# Patient Record
Sex: Female | Born: 1950 | Race: White | Hispanic: No | Marital: Married | Smoking: Former smoker
Health system: Southern US, Community
[De-identification: ages and names within clinical notes are randomized; demographics above are authoritative.]

## PROBLEM LIST (undated history)

## (undated) DIAGNOSIS — Z8614 Personal history of Methicillin resistant Staphylococcus aureus infection: Secondary | ICD-10-CM

## (undated) DIAGNOSIS — E119 Type 2 diabetes mellitus without complications: Secondary | ICD-10-CM

## (undated) DIAGNOSIS — H269 Unspecified cataract: Secondary | ICD-10-CM

## (undated) DIAGNOSIS — T7840XA Allergy, unspecified, initial encounter: Secondary | ICD-10-CM

## (undated) DIAGNOSIS — Z5189 Encounter for other specified aftercare: Secondary | ICD-10-CM

## (undated) DIAGNOSIS — E079 Disorder of thyroid, unspecified: Secondary | ICD-10-CM

## (undated) DIAGNOSIS — R011 Cardiac murmur, unspecified: Secondary | ICD-10-CM

## (undated) HISTORY — PX: CATARACT EXTRACTION: SUR2

## (undated) HISTORY — PX: LIPOMA EXCISION: SHX5283

## (undated) HISTORY — DX: Allergy, unspecified, initial encounter: T78.40XA

## (undated) HISTORY — PX: DILATION AND CURETTAGE OF UTERUS: SHX78

## (undated) HISTORY — DX: Unspecified cataract: H26.9

## (undated) HISTORY — DX: Personal history of Methicillin resistant Staphylococcus aureus infection: Z86.14

## (undated) HISTORY — DX: Encounter for other specified aftercare: Z51.89

## (undated) HISTORY — DX: Cardiac murmur, unspecified: R01.1

---

## 1977-05-01 DIAGNOSIS — Z5189 Encounter for other specified aftercare: Secondary | ICD-10-CM

## 1977-05-01 HISTORY — DX: Encounter for other specified aftercare: Z51.89

## 1993-05-01 HISTORY — PX: EYE SURGERY: SHX253

## 2009-05-01 HISTORY — PX: LIPOMA EXCISION: SHX5283

## 2015-12-27 ENCOUNTER — Emergency Department: Payer: BLUE CROSS/BLUE SHIELD

## 2015-12-27 ENCOUNTER — Emergency Department
Admission: EM | Admit: 2015-12-27 | Discharge: 2015-12-27 | Disposition: A | Discharge status: Home / Self Care | Payer: BLUE CROSS/BLUE SHIELD | Attending: Emergency Medicine | Admitting: Emergency Medicine

## 2015-12-27 ENCOUNTER — Encounter: Payer: Self-pay | Admitting: Emergency Medicine

## 2015-12-27 DIAGNOSIS — E119 Type 2 diabetes mellitus without complications: Secondary | ICD-10-CM | POA: Diagnosis not present

## 2015-12-27 DIAGNOSIS — G8929 Other chronic pain: Secondary | ICD-10-CM | POA: Diagnosis not present

## 2015-12-27 DIAGNOSIS — G510 Bell's palsy: Secondary | ICD-10-CM | POA: Diagnosis not present

## 2015-12-27 DIAGNOSIS — R29810 Facial weakness: Secondary | ICD-10-CM | POA: Diagnosis present

## 2015-12-27 DIAGNOSIS — I10 Essential (primary) hypertension: Secondary | ICD-10-CM | POA: Insufficient documentation

## 2015-12-27 DIAGNOSIS — M549 Dorsalgia, unspecified: Secondary | ICD-10-CM | POA: Diagnosis not present

## 2015-12-27 HISTORY — DX: Type 2 diabetes mellitus without complications: E11.9

## 2015-12-27 HISTORY — DX: Disorder of thyroid, unspecified: E07.9

## 2015-12-27 LAB — CBC WITH DIFFERENTIAL/PLATELET
BASOS ABS: 0 10*3/uL (ref 0–0.1)
Basophils Relative: 1 %
EOS PCT: 2 %
Eosinophils Absolute: 0.1 10*3/uL (ref 0–0.7)
HEMATOCRIT: 39.9 % (ref 35.0–47.0)
Hemoglobin: 13.9 g/dL (ref 12.0–16.0)
LYMPHS ABS: 1.7 10*3/uL (ref 1.0–3.6)
LYMPHS PCT: 26 %
MCH: 28.3 pg (ref 26.0–34.0)
MCHC: 34.9 g/dL (ref 32.0–36.0)
MCV: 81.3 fL (ref 80.0–100.0)
MONO ABS: 0.3 10*3/uL (ref 0.2–0.9)
MONOS PCT: 4 %
NEUTROS ABS: 4.6 10*3/uL (ref 1.4–6.5)
Neutrophils Relative %: 67 %
Platelets: 217 10*3/uL (ref 150–440)
RBC: 4.91 MIL/uL (ref 3.80–5.20)
RDW: 14.3 % (ref 11.5–14.5)
WBC: 6.8 10*3/uL (ref 3.6–11.0)

## 2015-12-27 LAB — COMPREHENSIVE METABOLIC PANEL WITH GFR
ALT: 22 U/L (ref 14–54)
AST: 26 U/L (ref 15–41)
Albumin: 4.5 g/dL (ref 3.5–5.0)
Alkaline Phosphatase: 66 U/L (ref 38–126)
Anion gap: 8 (ref 5–15)
BUN: 12 mg/dL (ref 6–20)
CO2: 26 mmol/L (ref 22–32)
Calcium: 9.3 mg/dL (ref 8.9–10.3)
Chloride: 104 mmol/L (ref 101–111)
Creatinine, Ser: 0.57 mg/dL (ref 0.44–1.00)
GFR calc Af Amer: >60 mL/min
GFR calc non Af Amer: >60 mL/min
Glucose, Bld: 186 mg/dL — ABNORMAL HIGH (ref 65–99)
Potassium: 3.9 mmol/L (ref 3.5–5.1)
Sodium: 138 mmol/L (ref 135–145)
Total Bilirubin: 0.4 mg/dL (ref 0.3–1.2)
Total Protein: 7.3 g/dL (ref 6.5–8.1)

## 2015-12-27 LAB — TROPONIN I: Troponin I: <0.03 ng/mL

## 2015-12-27 MED ORDER — VALACYCLOVIR HCL 1 G PO TABS: 1000.0000 mg | ORAL_TABLET | Freq: Three times a day (TID) | ORAL | 0 refills | Status: AC

## 2015-12-27 MED ORDER — PREDNISONE 20 MG PO TABS: ORAL_TABLET | ORAL | 0 refills | Status: AC

## 2015-12-27 NOTE — ED Triage Notes (Signed)
Pt states noticed that she could not close her left eye three days ago and since has resolved and noticed that her smile was crooked. Denies any trouble with speech and denies any weakness. Some facial droop noted to right side of mouth.

## 2015-12-27 NOTE — ED Provider Notes (Signed)
[facility]-EMERGENCY DEPARTMENT Provider Note   CSN: [id number] Arrival date & time: 12/27/15  1478     History   Chief Complaint Chief Complaint  Patient presents with  . Facial Droop    left eye would not close three days ago but has now resolved    HPI Catherine Dodson is a 66 y.o. female hx of DM, HTN, Here presenting with right facial droop, trouble closing right eye. Patient states that the last 2 days she noticed that her right eye is a little dry and has some trouble closing her right eye. She woke up this morning and realized the right side of her face looks abnormal. Denies any trouble speaking or weakness. She does have chronic right sided back pain and is seeing a Land. She does noticed shooting pain on the right side of her chest for the last 2 days. Denies shortness of breath or rash. Denies fevers or tick bites. Doesn't remember if she has previous chicken pox or not.   The history is provided by the patient.    Past Medical History:  Diagnosis Date  . Diabetes mellitus without complication (HCC)   . Hypertension   . Thyroid disease     There are no active problems to display for this patient.   Past Surgical History:  Procedure Laterality Date  . ABDOMINAL SURGERY      OB History    No data available       Home Medications    Prior to Admission medications   Not on File    Family History No family history on file.  Social History Social History  Substance Use Topics  . Smoking status: Never Smoker  . Smokeless tobacco: Never Used  . Alcohol use No     Allergies   Ceclor [cefaclor]; Morphine and related; Penicillins; and Xylocaine [lidocaine hcl]   Review of Systems Review of Systems  Musculoskeletal: Positive for back pain.  Neurological: Positive for weakness.  All other systems reviewed and are negative.    Physical Exam Updated Vital Signs BP (!) 157/85 (BP Location: Right Arm)   Pulse 78   Temp 97.7 F (36.5 C)  (Oral)   Resp 16   Ht 5\' 6"  (1.676 m)   Wt 162 lb (73.5 kg)   SpO2 98%   BMI 26.15 kg/m   Physical Exam  Constitutional: She is oriented to person, place, and time. She appears well-developed and well-nourished.  Slightly anxious   HENT:  Head: Normocephalic.  Eyes: EOM are normal. Pupils are equal, round, and reactive to light.  Neck: Normal range of motion. Neck supple.  Cardiovascular: Normal rate, regular rhythm and normal heart sounds.   Pulmonary/Chest: Breath sounds normal. No respiratory distress. She has no rales.  No obvious rash on R side of chest   Abdominal: Soft. Bowel sounds are normal. She exhibits no distension. There is no tenderness.  Musculoskeletal: Normal range of motion.  Neurological: She is alert and oriented to person, place, and time.  R facial droop. Also trouble closing R eye. Slightly dec sensation R face. Nl strength bilateral upper and lower extremities.   Skin: Skin is warm.  Psychiatric: She has a normal mood and affect.  Nursing note and vitals reviewed.    ED Treatments / Results  Labs (all labs ordered are listed, but only abnormal results are displayed) Labs Reviewed  COMPREHENSIVE METABOLIC PANEL - Abnormal; Notable for the following:       Result Value  Glucose, Bld 186 (*)    All other components within normal limits  CBC WITH DIFFERENTIAL/PLATELET  TROPONIN I    EKG  EKG Interpretation None       ED ECG REPORT I, Richardean Canalavid H Marci Polito, the attending physician, personally viewed and interpreted this ECG.   Date: 12/27/2015  EKG Time: 11:57 am  Rate: 80  Rhythm: normal EKG, normal sinus rhythm  Axis: normal  Intervals:none  ST&T Change: nonspecific   Radiology Dg Chest 2 View  Result Date: 12/27/2015 CLINICAL DATA:  Right rib pain. EXAM: CHEST  2 VIEW COMPARISON:  None. FINDINGS: The heart size and mediastinal contours are within normal limits. Both lungs are clear. No pneumothorax or pleural effusion is noted. The visualized  skeletal structures are unremarkable. IMPRESSION: No active cardiopulmonary disease. Electronically Signed   By: Lupita RaiderJames  Green Jr, M.D.   On: 12/27/2015 12:44   Ct Head Wo Contrast  Result Date: 12/27/2015 CLINICAL DATA:  Right-sided facial droop for 3 days. EXAM: CT HEAD WITHOUT CONTRAST TECHNIQUE: Contiguous axial images were obtained from the base of the skull through the vertex without intravenous contrast. COMPARISON:  None. FINDINGS: Bony calvarium appears intact. No mass effect or midline shift is noted. Ventricular size is within normal limits. There is no evidence of mass lesion, hemorrhage or acute infarction. IMPRESSION: Normal head CT. Electronically Signed   By: Lupita RaiderJames  Green Jr, M.D.   On: 12/27/2015 10:47    Procedures Procedures (including critical care time)  Medications Ordered in ED Medications - No data to display   Initial Impression / Assessment and Plan / ED Course  I have reviewed the triage vital signs and the nursing notes.  Pertinent labs & imaging results that were available during my care of the patient were reviewed by me and considered in my medical decision making (see chart for details).  Clinical Course   Catherine Dodson is a 65 y.o. female here with R facial droop, trouble closing R eye. 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 Nl strength throughout. Has chronic back pain but nl neuro exam. Has R sided chest pain with no obvious rash suggestive of shingles. Will check labs, CT head. Likely dc home with prednisone, valtrex.   1:33 PM CT head unremarkable. Labs showed glucose 186 with nl AG. She has hx of DM and is on meds already. Will give valtrex, prednisone. Told her that she should monitor her blood sugar frequently as steroids may increase her glucose.    Final Clinical Impressions(s) / ED Diagnoses   Final diagnoses:  None    New Prescriptions New Prescriptions   No medications on file     Charlynne Panderavid Hsienta Sharyon Peitz, MD 12/27/15 1334

## 2015-12-27 NOTE — Discharge Instructions (Signed)
Take prednisone as prescribed.   Take valtrex three times daily for a week.   Use eye drops to the right eye if your eye is dry. Please tape your eye or put eye patch at night if you have trouble closing your eye.   See your doctor.   Return to ER if you have worse weakness, trouble speaking, blurry vision, fevers, rash.

## 2016-01-18 ENCOUNTER — Other Ambulatory Visit: Payer: Self-pay | Admitting: Obstetrics & Gynecology

## 2016-01-18 DIAGNOSIS — Z1231 Encounter for screening mammogram for malignant neoplasm of breast: Secondary | ICD-10-CM

## 2016-02-02 ENCOUNTER — Other Ambulatory Visit: Payer: Self-pay | Admitting: Obstetrics & Gynecology

## 2016-02-02 ENCOUNTER — Ambulatory Visit
Admission: RE | Admit: 2016-02-02 | Discharge: 2016-02-02 | Disposition: A | Discharge status: Home / Self Care | Payer: BLUE CROSS/BLUE SHIELD | Source: Ambulatory Visit | Attending: Obstetrics & Gynecology | Admitting: Obstetrics & Gynecology

## 2016-02-02 DIAGNOSIS — Z1231 Encounter for screening mammogram for malignant neoplasm of breast: Secondary | ICD-10-CM

## 2016-02-09 ENCOUNTER — Inpatient Hospital Stay
Admission: RE | Admit: 2016-02-09 | Discharge: 2016-02-09 | Disposition: A | Discharge status: Home / Self Care | Payer: Self-pay | Source: Ambulatory Visit | Attending: *Deleted | Admitting: *Deleted

## 2016-02-09 ENCOUNTER — Other Ambulatory Visit: Payer: Self-pay | Admitting: *Deleted

## 2016-02-09 DIAGNOSIS — Z9289 Personal history of other medical treatment: Secondary | ICD-10-CM

## 2016-04-14 ENCOUNTER — Ambulatory Visit: Payer: BLUE CROSS/BLUE SHIELD | Admitting: Family Medicine

## 2016-05-18 ENCOUNTER — Ambulatory Visit: Payer: BLUE CROSS/BLUE SHIELD | Admitting: Family Medicine

## 2016-05-19 ENCOUNTER — Telehealth: Payer: Self-pay | Admitting: General Practice

## 2016-05-19 NOTE — Telephone Encounter (Signed)
She has not seen me, therefore I cannot refill. She has seen Endo. They can refill until she sees me.

## 2016-05-19 NOTE — Telephone Encounter (Signed)
Pt had an appointment yesterday and got rescheduled. Endo wants her to be seen and pt cant afford 150$. Pt was given 06/08/16 for her NP appt.

## 2016-05-19 NOTE — Telephone Encounter (Signed)
Okay to refill up to her appt in February?

## 2016-05-19 NOTE — Telephone Encounter (Signed)
[facility] called pt is needing a refill for metformin extended release 500mg  2 qd bid.  [facility] [place] [phone]. Thank you!

## 2016-05-22 ENCOUNTER — Encounter: Payer: Self-pay | Admitting: Family Medicine

## 2016-05-22 NOTE — Telephone Encounter (Signed)
Pt aware.

## 2016-05-22 NOTE — Telephone Encounter (Signed)
Needs appt

## 2016-06-08 ENCOUNTER — Ambulatory Visit (INDEPENDENT_AMBULATORY_CARE_PROVIDER_SITE_OTHER): Payer: BLUE CROSS/BLUE SHIELD | Admitting: Family Medicine

## 2016-06-08 ENCOUNTER — Encounter: Payer: Self-pay | Admitting: Family Medicine

## 2016-06-08 VITALS — BP 164/91 | HR 83 | Temp 98.1°F | Ht 65.5 in | Wt 172.4 lb

## 2016-06-08 DIAGNOSIS — E039 Hypothyroidism, unspecified: Secondary | ICD-10-CM | POA: Diagnosis not present

## 2016-06-08 DIAGNOSIS — R030 Elevated blood-pressure reading, without diagnosis of hypertension: Secondary | ICD-10-CM | POA: Diagnosis not present

## 2016-06-08 DIAGNOSIS — E119 Type 2 diabetes mellitus without complications: Secondary | ICD-10-CM

## 2016-06-08 DIAGNOSIS — Z789 Other specified health status: Secondary | ICD-10-CM

## 2016-06-08 LAB — LIPID PANEL
CHOLESTEROL: 210 mg/dL — AB (ref 0–200)
HDL: 51.4 mg/dL (ref >39.00)
LDL CALC: 128 mg/dL — AB (ref 0–99)
NonHDL: 158.63
Total CHOL/HDL Ratio: 4
Triglycerides: 154 mg/dL — ABNORMAL HIGH (ref 0.0–149.0)
VLDL: 30.8 mg/dL (ref 0.0–40.0)

## 2016-06-08 LAB — TSH: TSH: 1.27 u[IU]/mL (ref 0.35–4.50)

## 2016-06-08 LAB — HEMOGLOBIN A1C: Hgb A1c MFr Bld: 9.5 % — ABNORMAL HIGH (ref 4.6–6.5)

## 2016-06-08 MED ORDER — GLUCOSE BLOOD VI STRP: ORAL_STRIP | 12 refills | Status: DC

## 2016-06-08 MED ORDER — THYROID 90 MG PO TABS: 90.0000 mg | ORAL_TABLET | Freq: Every day | ORAL | 3 refills | Status: DC

## 2016-06-08 MED ORDER — GLYBURIDE 5 MG PO TABS: 5.0000 mg | ORAL_TABLET | Freq: Two times a day (BID) | ORAL | 1 refills | Status: DC

## 2016-06-08 MED ORDER — METFORMIN HCL ER 500 MG PO TB24: 1000.0000 mg | ORAL_TABLET | Freq: Two times a day (BID) | ORAL | 1 refills | Status: DC

## 2016-06-08 NOTE — Assessment & Plan Note (Signed)
Stable. TSH today.  Continue current dose of NP thyroid.

## 2016-06-08 NOTE — Progress Notes (Signed)
Subjective:  Patient ID: Catherine Dodson, female    DOB: 04/29/1951  Age: 66 y.o. MRN: [id number]  CC: Establish care  HPI Catherine Dodson is a 66 y.o. female presents to the clinic today to establish care. Issues are below.   DM-2  Blood sugars readings - Has not been checking her sugars as she has been out of strips. Uncontrolled per A1C's available.  Hypoglycemia - No.  Medications - Metformin ER 1000 mg BID, Glyburide 5 mg BID.  Adverse effects - Has some GI symptoms from Metformin.  Compliance - Yes.   Hypothyroidism  Stable on NP Thyroid.  Elevated BP  BP elevated today.  Patient states her blood pressures are often elevated in doctors offices.  On no meds.  Will discuss today.  PMH, Surgical Hx, Family Hx, Social History reviewed and updated as below.  Past Medical History:  Diagnosis Date  . Allergy penicillin, novocaine, demerol, latex  . Blood transfusion without reported diagnosis 1979  . Diabetes mellitus without complication (HCC) Type 2  . History of MRSA infection   . Thyroid disease    Past Surgical History:  Procedure Laterality Date  . CATARACT EXTRACTION     2015  . CESAREAN SECTION    . CESAREAN SECTION  1986, 1992  . DILATION AND CURETTAGE OF UTERUS    . EYE SURGERY  1995  . LIPOMA EXCISION     Family History  Problem Relation Age of Onset  . Arthritis Maternal Grandmother   . Diabetes Maternal Grandmother   . Miscarriages / Stillbirths Maternal Grandmother   . Stroke Maternal Grandmother   . Alcohol abuse Maternal Grandmother   . Lung cancer Father   . Heart disease Maternal Grandfather   . Hyperlipidemia Mother   . Hypertension Mother   . Heart disease Mother   . Hypertension Sister   . Breast cancer Neg Hx    Social History  Substance Use Topics  . Smoking status: Never Smoker  . Smokeless tobacco: Never Used  . Alcohol use No   Review of Systems General: Denies unexplained weight loss, fever. Skin: Denies new or  changing mole, sore/wound that won't heal. ENT: Trouble hearing, ringing in the ears, sores in the mouth, hoarseness, trouble swallowing. Eyes: Denies trouble seeing/visual disturbance. Heart/CV: Denies chest pain, shortness of breath, edema, palpitations. Lungs/Resp: Denies cough, shortness of breath, hemoptysis. Abd/GI: Denies nausea, vomiting, diarrhea, constipation, abdominal pain, hematochezia, melena. GU: Denies dysuria, incontinence, hematuria, urinary frequency, difficulty starting/keeping stream, vaginal discharge, sexual difficulty, lump in breasts. MSK: Denies joint pain/swelling, myalgias. Neuro: Denies headaches, weakness, numbness, dizziness, syncope. Psych: Denies sadness, anxiety, stress, memory difficulty. Endocrine: Denies polyuria and polydipsia.  Objective:   Today's Vitals: BP (!) 164/91   Pulse 83   Temp 98.1 F (36.7 C) (Oral)   Ht 5' 5.5" (1.664 m)   Wt 172 lb 6.4 oz (78.2 kg)   SpO2 98%   BMI 28.25 kg/m   Physical Exam  Constitutional: She is oriented to person, place, and time. She appears well-developed and well-nourished. No distress.  HENT:  Head: Normocephalic and atraumatic.  Nose: Nose normal.  Mouth/Throat: Oropharynx is clear and moist. No oropharyngeal exudate.  Normal TM's bilaterally.   Eyes: Conjunctivae are normal. No scleral icterus.  Neck: Neck supple.  ? Thyroid nodule (R).  Cardiovascular: Normal rate and regular rhythm.   No murmur heard. Pulmonary/Chest: Effort normal and breath sounds normal. She has no wheezes. She has no rales.  Abdominal: Soft.  She exhibits no distension. There is no tenderness. There is no rebound and no guarding.  Musculoskeletal: Normal range of motion. She exhibits no edema.  Neurological: She is alert and oriented to person, place, and time.  Skin: Skin is warm and dry. No rash noted.  Psychiatric: She has a normal mood and affect.  Flat affect.  Vitals reviewed.  Assessment & Plan:   Problem List  Items Addressed This Visit    DM (diabetes mellitus), type 2 (HCC)    Uncontrolled. A1C today. Continue Metformin and Glyburide. Refuses insulin. Will likely need to add medication - Jardiance/tradjenta. Sending to Diabetes educator.       Relevant Medications   glyBURIDE (DIABETA) 5 MG tablet   metFORMIN (GLUCOPHAGE-XR) 500 MG 24 hr tablet   Other Relevant Orders   Hemoglobin A1c   Lipid panel   Elevated BP without diagnosis of hypertension    Advised the patient to check at home or check regularly at the drugstore. If BPs are consistently elevated we will need to start on ACEI.      Hypothyroidism    Stable. TSH today.  Continue current dose of NP thyroid.      Relevant Medications   thyroid (NP THYROID) 90 MG tablet   Other Relevant Orders   TSH   Vegetarian      Outpatient Encounter Prescriptions as of 06/08/2016  Medication Sig  . glyBURIDE (DIABETA) 5 MG tablet Take 1 tablet (5 mg total) by mouth 2 (two) times daily with a meal.  . metFORMIN (GLUCOPHAGE-XR) 500 MG 24 hr tablet Take 2 tablets (1,000 mg total) by mouth 2 (two) times daily.  Marland Kitchen. thyroid (NP THYROID) 90 MG tablet Take 1 tablet (90 mg total) by mouth daily.  Marland Kitchen. UNABLE TO FIND Numerous OTC supplements- see scanned-in list from 02/01/15  . [DISCONTINUED] glucose blood ([employer] TEST STRIPS) test strip Use 2 (two) times daily. Use as instructed.  . [DISCONTINUED] glyBURIDE (DIABETA) 5 MG tablet Take by mouth.  . [DISCONTINUED] metFORMIN (GLUCOPHAGE-XR) 500 MG 24 hr tablet Take 1,000 mg by mouth 2 (two) times daily.  . [DISCONTINUED] thyroid (NP THYROID) 90 MG tablet Take by mouth.  Marland Kitchen. glucose blood test strip Use as instructed to check blood sugar up to three times daily. E11.9   No facility-administered encounter medications on file as of 06/08/2016.     Follow-up: Pending labs.  Everlene OtherJayce Cassie Shedlock DO [facility] [place]

## 2016-06-08 NOTE — Patient Instructions (Signed)
Continue your meds while we await your labs.  We will arrange for you to see a diabetes educator.  Follow up to be scheduled pending your labs.  Take care  Dr. Adriana Simasook

## 2016-06-08 NOTE — Assessment & Plan Note (Signed)
Advised the patient to check at home or check regularly at the drugstore. If BPs are consistently elevated we will need to start on ACEI.

## 2016-06-08 NOTE — Assessment & Plan Note (Signed)
Uncontrolled. A1C today. Continue Metformin and Glyburide. Refuses insulin. Will likely need to add medication - Jardiance/tradjenta. Sending to Diabetes educator.

## 2016-06-08 NOTE — Progress Notes (Signed)
Pre visit review using our clinic review tool, if applicable. No additional management support is needed unless otherwise documented below in the visit note. 

## 2016-06-09 ENCOUNTER — Other Ambulatory Visit: Payer: Self-pay | Admitting: Family Medicine

## 2016-06-09 ENCOUNTER — Encounter: Payer: Self-pay | Admitting: Family Medicine

## 2016-06-09 DIAGNOSIS — E119 Type 2 diabetes mellitus without complications: Secondary | ICD-10-CM

## 2016-06-26 ENCOUNTER — Telehealth: Payer: Self-pay | Admitting: Family Medicine

## 2016-06-26 NOTE — Telephone Encounter (Signed)
Recommendations?

## 2016-06-26 NOTE — Telephone Encounter (Signed)
Pt was called and she was scheduled with Kelsy on 07/03/16 at 4 p.m.

## 2016-06-26 NOTE — Telephone Encounter (Signed)
Pt called and stated that when she was in the office Dr. Adriana Simasook gave her sample of 2 medications because we did not have the combination medication that he wanted her to take. Pt was wondering if the mgs of the 2 medications are the same as if it was the one combination medication? She is wondering if she can stay on the medication that she is currently taking for her diabetes because it is cheaper for her. States that the side effects of the other medication are quite concerning. Pt also states that she cannot afford to take the diabetes education class because of cost.  Please advise, thank you!  Call pt @ [phone]

## 2016-06-26 NOTE — Telephone Encounter (Signed)
Continue metformin and glyburide. If she is having side effects that she is unsatisfied with she can stop the new medications. She needs to see Adelina MingsKelsey.

## 2016-06-27 ENCOUNTER — Ambulatory Visit: Payer: BLUE CROSS/BLUE SHIELD | Admitting: Dietician

## 2016-07-03 ENCOUNTER — Encounter: Payer: Self-pay | Admitting: Pharmacist

## 2016-07-03 ENCOUNTER — Ambulatory Visit (INDEPENDENT_AMBULATORY_CARE_PROVIDER_SITE_OTHER): Payer: BLUE CROSS/BLUE SHIELD | Admitting: Pharmacist

## 2016-07-03 DIAGNOSIS — E119 Type 2 diabetes mellitus without complications: Secondary | ICD-10-CM

## 2016-07-03 MED ORDER — EMPAGLIFLOZIN 10 MG PO TABS: 10.0000 mg | ORAL_TABLET | Freq: Every day | ORAL | 3 refills | Status: DC

## 2016-07-03 NOTE — Progress Notes (Signed)
S:    Chief Complaint  Patient presents with  . Medication Management    Diabetes    Patient arrives in good spirits with a back of samples previously provided by Dr Adriana Simasook.  Presents for diabetes evaluation, education, and management at the request of Dr Adriana Simasook. Patient was referred on 06/26/16 (telephone note).  Patient was last seen by Primary Care Provider on 06/08/16.   Today she states she did not start Tradjenta or Jardiance samples after reading the package insert and states she likes older medications with more evidence.  Reports having joint pain and "easy injuries" since starting Glyburide.  States she is hesitant to start insulin due to "a lab test stating she still produces insulin."  She reports wanting to control her diabetes more naturally. She plans to enroll in a part D plan in may and would like cheaper options until starting this plan.   Patient reports Diabetes was diagnosed for 20 years.   Family/Social History: possibly family history with grandmother.   Patient reports adherence with medications.  Current diabetes medications include: metformin XR 1000 mg BID, glyburide 2.5 mg BID Current hypertension medications include: none  Patient denies hypoglycemic events.   Patient reported dietary habits: Eats 4 small meals/day + small snacks.  Is vegetarian (trying to decrease beans/rice and eat more tofu and vegetables) Breakfast:english muffin with almond butter Lunch:sweet potatoes, mushrooms, spinach, with egg Snack: Tangerine Dinner: Tofu and vegetables Snack:Tumeric milk  Drinks:Lemon water, water, tea, Stevia soda  Patient reported exercise habits: Water aerobics at [employer] 4 times/week.  Walk dogs 1.5 miles 1-2 times per day.     Patient reports nocturia 1x/night. Denies pain/burning upon urination.  Patient denies neuropathy. Patient denies visual changes. Patient reports self foot exams. Denies changes  CBGs (did not bring meter to clinic  today) Reported Fasting CBG: 88, 108 mg/dL  Random CBG: 454180 mg/dL  O:  Physical Exam  Vitals reviewed.  Review of Systems  Constitutional: Negative.   HENT: Negative.     Lab Results  Component Value Date   HGBA1C 9.5 (H) 06/08/2016   Lipid Panel     Component Value Date/Time   CHOL 210 (H) 06/08/2016 1151   TRIG 154.0 (H) 06/08/2016 1151   HDL 51.40 06/08/2016 1151   CHOLHDL 4 06/08/2016 1151   VLDL 30.8 06/08/2016 1151   LDLCALC 128 (H) 06/08/2016 1151     A/P: Diabetes longstanding diagnosed currently uncontrolled. Patient denies hypoglycemic events and is able to verbalize appropriate hypoglycemia management plan. Patient reports adherence with metformin/glyburide but has not started Tradjenta or Jardiance samples provided by Dr Adriana Simasook. Control is suboptimal due to insulin resistance and patient reluctance to start medications other than supplements. Following discussion and approval by Dr Adriana Simasook, the following medication changes were made:  Extensively discussed SGLT2, Sulfonylureas, Metformin, DPP4 inhibitors and the diabetes disease process Discussed low carbohydrate diet and signs/symptoms/treatment of hypoglycemia. Started Jardiance (empagliflozin) 10 mg daily (samples previously provided by Dr Adriana Simasook at last visit) Will hold off on starting Tradjenta at this visit per patient request.  Will consider starting at next visit Stop Glyburide Instructed patient to start checking FBG and 2 hours post prandial lunch/supper  Next A1C anticipated 08/2016.   Patient candidate for statin, aspirin, ACE inhibitor.  Patient reluctant to start medications.  Will consider starting at next visit.    Written patient instructions provided.  Total time in face to face counseling 60 minutes.   Follow up in Pharmacist  Clinic Visit in 4 weeks.    Patient was seen with Dr Adriana Simas today in clinic and medication changes were discussed and approved prior to initiation

## 2016-07-03 NOTE — Progress Notes (Signed)
Care was provided under my supervision. I agree with the management as indicated in the note.  Ritisha Deitrick DO  

## 2016-07-03 NOTE — Assessment & Plan Note (Signed)
Diabetes longstanding diagnosed currently uncontrolled. Patient denies hypoglycemic events and is able to verbalize appropriate hypoglycemia management plan. Patient reports adherence with metformin/glyburide but has not started Tradjenta or Jardiance samples provided by Dr Adriana Simasook. Control is suboptimal due to insulin resistance and patient reluctance to start medications other than supplements. Following discussion and approval by Dr Adriana Simasook, the following medication changes were made:  Extensively discussed SGLT2, Sulfonylureas, Metformin, DPP4 inhibitors and the diabetes disease process Discussed low carbohydrate diet and signs/symptoms/treatment of hypoglycemia. Started Jardiance (empagliflozin) 10 mg daily (samples previously provided by Dr Adriana Simasook at last visit) Will hold off on starting Tradjenta at this visit per patient request.  Will consider starting at next visit Stop Glyburide Instructed patient to start checking FBG and 2 hours post prandial lunch/supper  Next A1C anticipated 08/2016.   Patient candidate for statin, aspirin, ACE inhibitor.  Patient reluctant to start medications.  Will consider starting at next visit.

## 2016-07-03 NOTE — Patient Instructions (Addendum)
Continue diet and exercise changes  Stop Glyburide Start Jardiance 10 mg daily.  Start checking fasting blood glucose and alternating 2 hours after lunch/dinner  Followup with Hazle NordmannKelsy Combs, PharmD in 4 weeks.

## 2016-07-03 NOTE — Progress Notes (Signed)
Care was provided under my supervision. I agree with the management as indicated in the note.  Dezmon Conover DO  

## 2016-07-05 ENCOUNTER — Telehealth: Payer: Self-pay | Admitting: *Deleted

## 2016-07-05 NOTE — Telephone Encounter (Signed)
Pt stated that she will need a new Rx to replace the empagliflozin. Pt has months worth of samples [facility] Pt contact [phone]

## 2016-07-05 NOTE — Telephone Encounter (Signed)
Pt was called and stated that jardiance was $400 she could not afford. Pt does still have samples that were given to her by Dr.Cook. She was told to take the samples and if we needed to we would supply those samples to her. Pt agreed.

## 2016-07-24 NOTE — Addendum Note (Signed)
Addended by: Hazle NordmannOMBS, KELSY E on: 07/24/2016 01:30 PM   Modules accepted: Level of Service

## 2016-07-24 NOTE — Addendum Note (Signed)
Addended by: Hazle NordmannOMBS, Reata Petrov E on: 07/24/2016 04:26 PM   Modules accepted: Level of Service

## 2016-08-01 ENCOUNTER — Encounter: Payer: Self-pay | Admitting: Family Medicine

## 2016-08-01 ENCOUNTER — Other Ambulatory Visit: Payer: Self-pay | Admitting: Family Medicine

## 2016-08-01 MED ORDER — METFORMIN HCL ER 500 MG PO TB24: 1000.0000 mg | ORAL_TABLET | Freq: Two times a day (BID) | ORAL | 1 refills | Status: DC

## 2016-08-02 MED ORDER — METFORMIN HCL ER 500 MG PO TB24
1000.0000 mg | ORAL_TABLET | Freq: Two times a day (BID) | ORAL | 1 refills | Status: DC
Start: 2016-08-02 — End: 2017-03-13

## 2016-08-02 MED ORDER — METFORMIN HCL ER 500 MG PO TB24
1000.0000 mg | ORAL_TABLET | Freq: Two times a day (BID) | ORAL | 1 refills | Status: DC
Start: 2016-08-02 — End: 2016-08-02

## 2016-08-02 NOTE — Telephone Encounter (Signed)
Rx was sent for only #180 and patient takes 2 pills twice a day. Resent new rx and sent message back to patient. thanks

## 2016-08-02 NOTE — Addendum Note (Signed)
Addended by: Jennelle Human on: 08/02/2016 08:06 AM   Modules accepted: Orders

## 2016-08-07 ENCOUNTER — Ambulatory Visit: Payer: BLUE CROSS/BLUE SHIELD | Admitting: Pharmacist

## 2016-09-26 ENCOUNTER — Telehealth: Payer: Self-pay | Admitting: Pharmacist

## 2016-09-26 NOTE — Telephone Encounter (Signed)
09/26/16 Called patient today to reschedule followup visit with pharmacist as she canceled last visit.  Spoke with patient on the phone briefly and she states she cannot talk right now and would not take the time to schedule followup visit.  She states she will call and schedule visit. Will followup in 1-2 weeks if she does not schedule visit.    Hazle NordmannKelsy Tashi Band, PharmD, BCPS [facility] PGY2 Pharmacy Resident [phone]

## 2016-11-06 DIAGNOSIS — M5413 Radiculopathy, cervicothoracic region: Secondary | ICD-10-CM | POA: Diagnosis not present

## 2016-11-06 DIAGNOSIS — M9902 Segmental and somatic dysfunction of thoracic region: Secondary | ICD-10-CM | POA: Diagnosis not present

## 2016-11-06 DIAGNOSIS — M545 Low back pain: Secondary | ICD-10-CM | POA: Diagnosis not present

## 2016-11-06 DIAGNOSIS — M9903 Segmental and somatic dysfunction of lumbar region: Secondary | ICD-10-CM | POA: Diagnosis not present

## 2016-11-08 DIAGNOSIS — M545 Low back pain: Secondary | ICD-10-CM | POA: Diagnosis not present

## 2016-11-08 DIAGNOSIS — M9903 Segmental and somatic dysfunction of lumbar region: Secondary | ICD-10-CM | POA: Diagnosis not present

## 2016-11-08 DIAGNOSIS — M5413 Radiculopathy, cervicothoracic region: Secondary | ICD-10-CM | POA: Diagnosis not present

## 2016-11-08 DIAGNOSIS — M9902 Segmental and somatic dysfunction of thoracic region: Secondary | ICD-10-CM | POA: Diagnosis not present

## 2016-11-17 DIAGNOSIS — M9903 Segmental and somatic dysfunction of lumbar region: Secondary | ICD-10-CM | POA: Diagnosis not present

## 2016-11-17 DIAGNOSIS — M5413 Radiculopathy, cervicothoracic region: Secondary | ICD-10-CM | POA: Diagnosis not present

## 2016-11-17 DIAGNOSIS — M545 Low back pain: Secondary | ICD-10-CM | POA: Diagnosis not present

## 2016-11-17 DIAGNOSIS — M9902 Segmental and somatic dysfunction of thoracic region: Secondary | ICD-10-CM | POA: Diagnosis not present

## 2016-11-24 DIAGNOSIS — M9902 Segmental and somatic dysfunction of thoracic region: Secondary | ICD-10-CM | POA: Diagnosis not present

## 2016-11-24 DIAGNOSIS — M5413 Radiculopathy, cervicothoracic region: Secondary | ICD-10-CM | POA: Diagnosis not present

## 2016-11-24 DIAGNOSIS — M9903 Segmental and somatic dysfunction of lumbar region: Secondary | ICD-10-CM | POA: Diagnosis not present

## 2016-11-24 DIAGNOSIS — M545 Low back pain: Secondary | ICD-10-CM | POA: Diagnosis not present

## 2016-12-08 DIAGNOSIS — M9902 Segmental and somatic dysfunction of thoracic region: Secondary | ICD-10-CM | POA: Diagnosis not present

## 2016-12-08 DIAGNOSIS — M5413 Radiculopathy, cervicothoracic region: Secondary | ICD-10-CM | POA: Diagnosis not present

## 2016-12-08 DIAGNOSIS — M545 Low back pain: Secondary | ICD-10-CM | POA: Diagnosis not present

## 2016-12-08 DIAGNOSIS — M9903 Segmental and somatic dysfunction of lumbar region: Secondary | ICD-10-CM | POA: Diagnosis not present

## 2016-12-25 DIAGNOSIS — R5383 Other fatigue: Secondary | ICD-10-CM | POA: Diagnosis not present

## 2016-12-25 DIAGNOSIS — E0800 Diabetes mellitus due to underlying condition with hyperosmolarity without nonketotic hyperglycemic-hyperosmolar coma (NKHHC): Secondary | ICD-10-CM | POA: Diagnosis not present

## 2016-12-25 DIAGNOSIS — R002 Palpitations: Secondary | ICD-10-CM | POA: Diagnosis not present

## 2016-12-25 DIAGNOSIS — E039 Hypothyroidism, unspecified: Secondary | ICD-10-CM | POA: Diagnosis not present

## 2016-12-25 DIAGNOSIS — M25561 Pain in right knee: Secondary | ICD-10-CM | POA: Diagnosis not present

## 2016-12-29 DIAGNOSIS — M545 Low back pain: Secondary | ICD-10-CM | POA: Diagnosis not present

## 2016-12-29 DIAGNOSIS — M5413 Radiculopathy, cervicothoracic region: Secondary | ICD-10-CM | POA: Diagnosis not present

## 2016-12-29 DIAGNOSIS — M9902 Segmental and somatic dysfunction of thoracic region: Secondary | ICD-10-CM | POA: Diagnosis not present

## 2016-12-29 DIAGNOSIS — M9903 Segmental and somatic dysfunction of lumbar region: Secondary | ICD-10-CM | POA: Diagnosis not present

## 2017-03-08 ENCOUNTER — Other Ambulatory Visit: Payer: Self-pay | Admitting: Family Medicine

## 2017-03-08 NOTE — Telephone Encounter (Signed)
Patient needs to establish with new PCP for refills or transfer please advise patient CRM created.

## 2017-03-13 ENCOUNTER — Encounter: Payer: Self-pay | Admitting: *Deleted

## 2017-03-19 NOTE — Telephone Encounter (Signed)
Unread mychart message mailed to patient 

## 2017-10-18 ENCOUNTER — Other Ambulatory Visit: Payer: Self-pay | Admitting: Family Medicine

## 2017-10-18 NOTE — Telephone Encounter (Signed)
Tried to reach patient by phone went straight to voicemail left message for patient to return call to office.

## 2017-10-23 ENCOUNTER — Ambulatory Visit: Payer: Self-pay | Admitting: Physician Assistant

## 2018-01-23 ENCOUNTER — Other Ambulatory Visit: Payer: Self-pay | Admitting: Family

## 2018-01-23 DIAGNOSIS — Z1231 Encounter for screening mammogram for malignant neoplasm of breast: Secondary | ICD-10-CM

## 2018-05-17 ENCOUNTER — Other Ambulatory Visit: Payer: Self-pay | Admitting: Family

## 2018-05-17 DIAGNOSIS — Z1231 Encounter for screening mammogram for malignant neoplasm of breast: Secondary | ICD-10-CM

## 2018-06-04 ENCOUNTER — Ambulatory Visit
Admission: RE | Admit: 2018-06-04 | Discharge: 2018-06-04 | Disposition: A | Discharge status: Home / Self Care | Payer: Medicare HMO | Source: Ambulatory Visit | Attending: Family | Admitting: Family

## 2018-06-04 DIAGNOSIS — Z1231 Encounter for screening mammogram for malignant neoplasm of breast: Secondary | ICD-10-CM | POA: Diagnosis present

## 2019-08-28 ENCOUNTER — Other Ambulatory Visit: Payer: Self-pay | Admitting: *Deleted

## 2019-08-28 DIAGNOSIS — Z1231 Encounter for screening mammogram for malignant neoplasm of breast: Secondary | ICD-10-CM

## 2019-09-08 ENCOUNTER — Ambulatory Visit
Admission: RE | Admit: 2019-09-08 | Discharge: 2019-09-08 | Disposition: A | Discharge status: Home / Self Care | Payer: Medicare HMO | Source: Ambulatory Visit | Attending: *Deleted | Admitting: *Deleted

## 2019-09-08 DIAGNOSIS — Z1231 Encounter for screening mammogram for malignant neoplasm of breast: Secondary | ICD-10-CM | POA: Diagnosis present

## 2020-05-06 DIAGNOSIS — E7211 Homocystinuria: Secondary | ICD-10-CM | POA: Diagnosis not present

## 2020-05-06 DIAGNOSIS — E785 Hyperlipidemia, unspecified: Secondary | ICD-10-CM | POA: Diagnosis not present

## 2020-05-06 DIAGNOSIS — M25551 Pain in right hip: Secondary | ICD-10-CM | POA: Diagnosis not present

## 2020-05-06 DIAGNOSIS — E039 Hypothyroidism, unspecified: Secondary | ICD-10-CM | POA: Diagnosis not present

## 2020-05-06 DIAGNOSIS — R030 Elevated blood-pressure reading, without diagnosis of hypertension: Secondary | ICD-10-CM | POA: Diagnosis not present

## 2020-05-06 DIAGNOSIS — E569 Vitamin deficiency, unspecified: Secondary | ICD-10-CM | POA: Diagnosis not present

## 2020-05-06 DIAGNOSIS — E119 Type 2 diabetes mellitus without complications: Secondary | ICD-10-CM | POA: Diagnosis not present

## 2020-05-06 DIAGNOSIS — E531 Pyridoxine deficiency: Secondary | ICD-10-CM | POA: Diagnosis not present

## 2020-05-06 DIAGNOSIS — K449 Diaphragmatic hernia without obstruction or gangrene: Secondary | ICD-10-CM | POA: Diagnosis not present

## 2020-05-06 DIAGNOSIS — E611 Iron deficiency: Secondary | ICD-10-CM | POA: Diagnosis not present

## 2020-05-06 DIAGNOSIS — E559 Vitamin D deficiency, unspecified: Secondary | ICD-10-CM | POA: Diagnosis not present

## 2020-05-06 DIAGNOSIS — E063 Autoimmune thyroiditis: Secondary | ICD-10-CM | POA: Diagnosis not present

## 2020-05-25 DIAGNOSIS — E611 Iron deficiency: Secondary | ICD-10-CM | POA: Diagnosis not present

## 2020-05-25 DIAGNOSIS — E119 Type 2 diabetes mellitus without complications: Secondary | ICD-10-CM | POA: Diagnosis not present

## 2020-05-25 DIAGNOSIS — E618 Deficiency of other specified nutrient elements: Secondary | ICD-10-CM | POA: Diagnosis not present

## 2020-08-02 DIAGNOSIS — Z0100 Encounter for examination of eyes and vision without abnormal findings: Secondary | ICD-10-CM | POA: Diagnosis not present

## 2020-08-17 ENCOUNTER — Other Ambulatory Visit: Payer: Self-pay | Admitting: *Deleted

## 2020-08-17 DIAGNOSIS — Z1231 Encounter for screening mammogram for malignant neoplasm of breast: Secondary | ICD-10-CM

## 2020-09-13 ENCOUNTER — Other Ambulatory Visit: Payer: Self-pay

## 2020-09-13 ENCOUNTER — Ambulatory Visit
Admission: RE | Admit: 2020-09-13 | Discharge: 2020-09-13 | Disposition: A | Discharge status: Home / Self Care | Payer: Medicare HMO | Source: Ambulatory Visit | Attending: *Deleted | Admitting: *Deleted

## 2020-09-13 DIAGNOSIS — Z1231 Encounter for screening mammogram for malignant neoplasm of breast: Secondary | ICD-10-CM | POA: Insufficient documentation

## 2020-09-13 DIAGNOSIS — E611 Iron deficiency: Secondary | ICD-10-CM | POA: Diagnosis not present

## 2020-09-13 DIAGNOSIS — E618 Deficiency of other specified nutrient elements: Secondary | ICD-10-CM | POA: Diagnosis not present

## 2020-09-13 DIAGNOSIS — E119 Type 2 diabetes mellitus without complications: Secondary | ICD-10-CM | POA: Diagnosis not present

## 2020-09-23 DIAGNOSIS — E119 Type 2 diabetes mellitus without complications: Secondary | ICD-10-CM | POA: Diagnosis not present

## 2020-09-23 DIAGNOSIS — E611 Iron deficiency: Secondary | ICD-10-CM | POA: Diagnosis not present

## 2020-09-23 DIAGNOSIS — E038 Other specified hypothyroidism: Secondary | ICD-10-CM | POA: Diagnosis not present

## 2021-04-07 DIAGNOSIS — H6591 Unspecified nonsuppurative otitis media, right ear: Secondary | ICD-10-CM | POA: Diagnosis not present

## 2021-06-10 LAB — COLOGUARD: COLOGUARD: NEGATIVE

## 2021-06-10 LAB — EXTERNAL GENERIC LAB PROCEDURE: COLOGUARD: NEGATIVE

## 2021-11-03 IMAGING — MG DIGITAL SCREENING BILAT W/ TOMO W/ CAD
6 of 10 series · 6 of 30 positions shown · non-contrast
Comparison: Previous exam(s).

CLINICAL DATA: Screening.

EXAM:
DIGITAL SCREENING BILATERAL MAMMOGRAM WITH TOMO AND CAD

[R CC synth-2D]
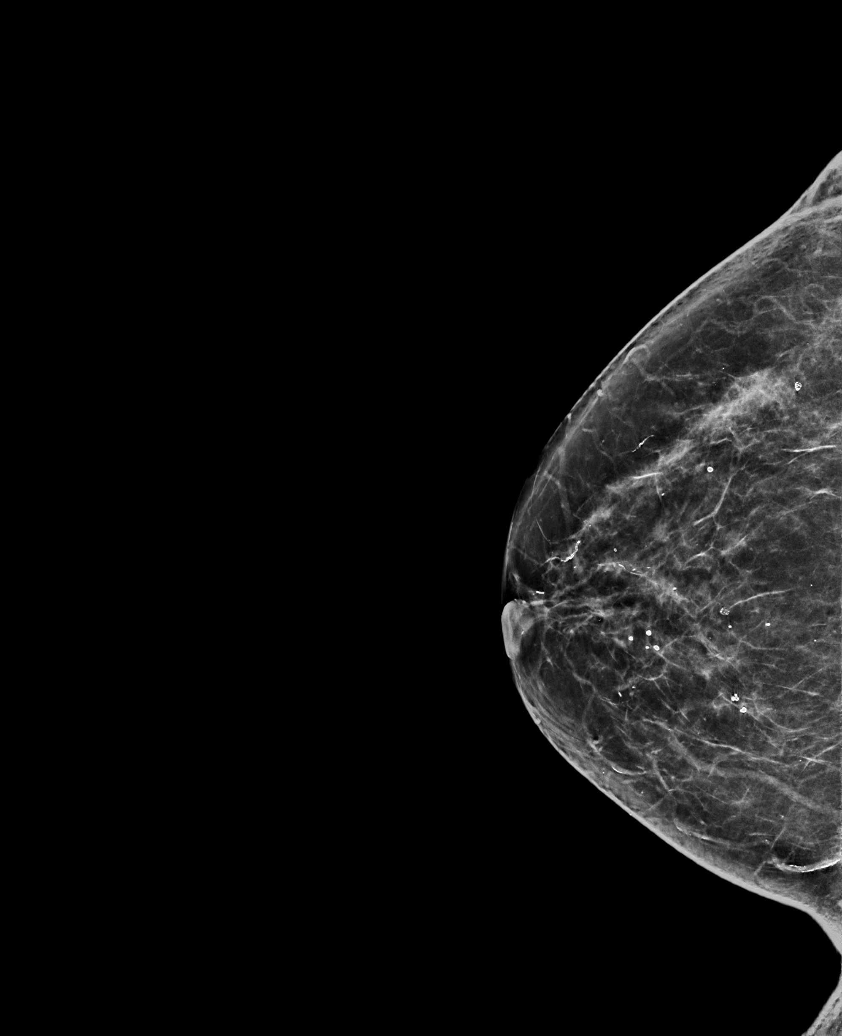

[L CC synth-2D]
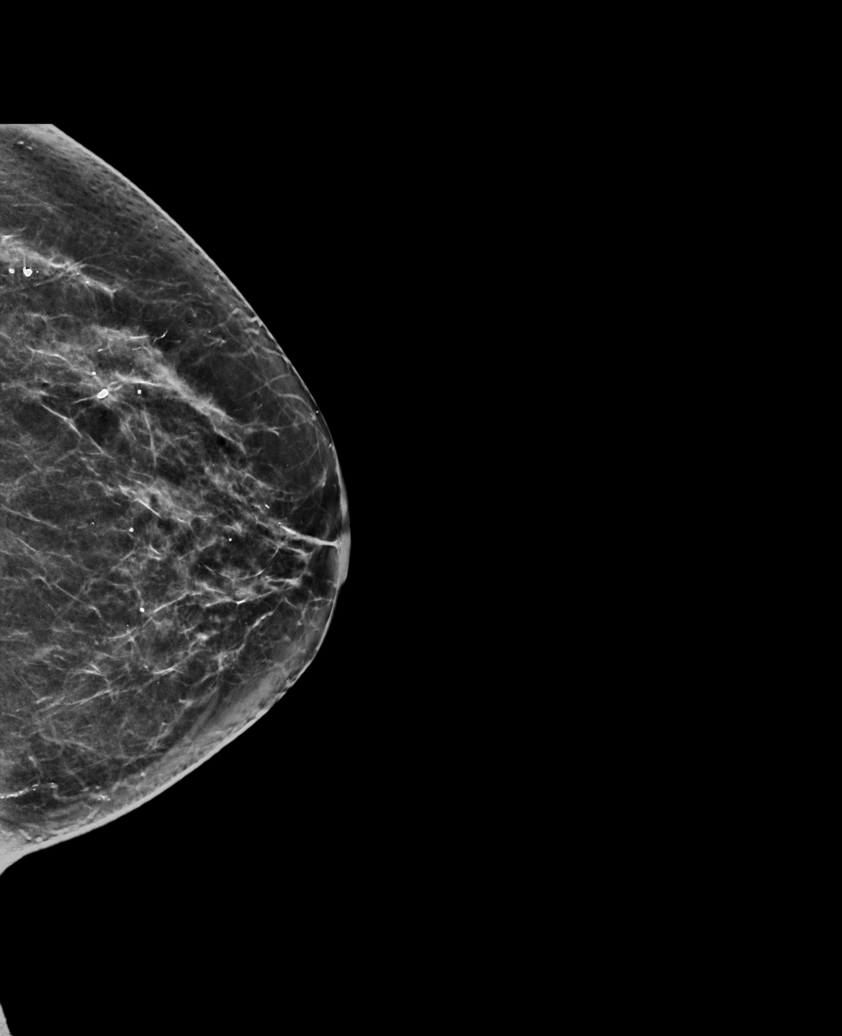

[L MLO synth-2D (1 of 2)]
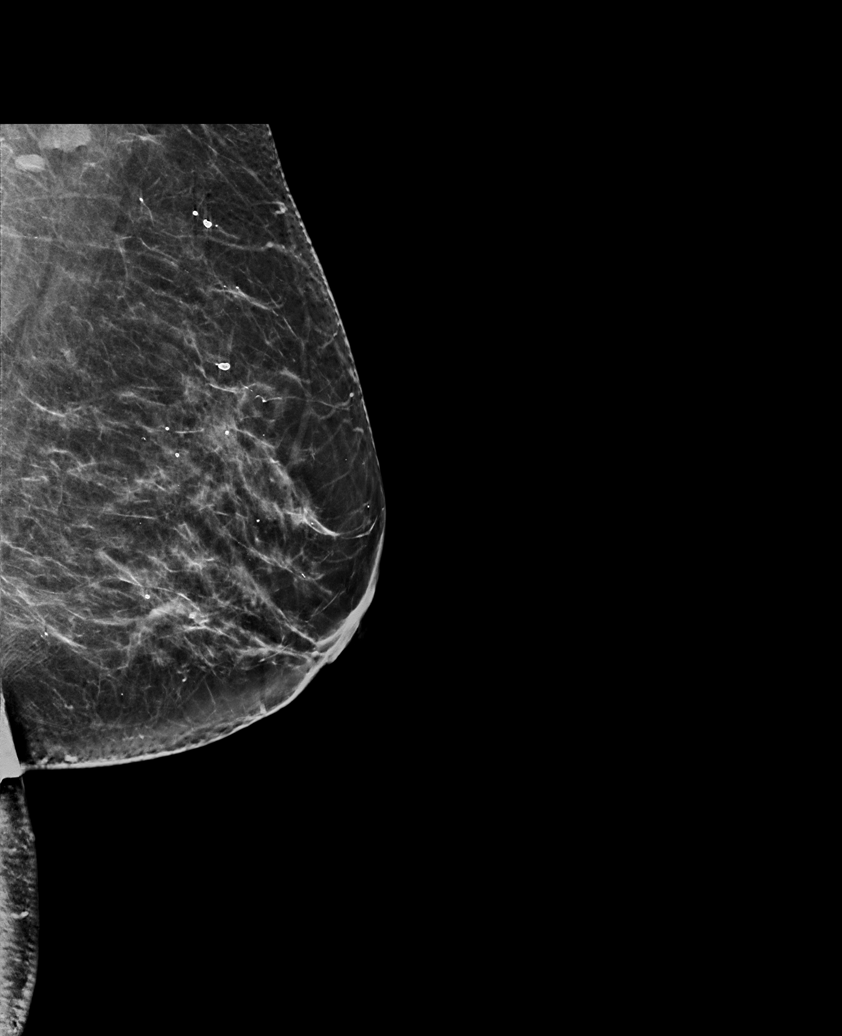

[L MLO synth-2D (2 of 2)]
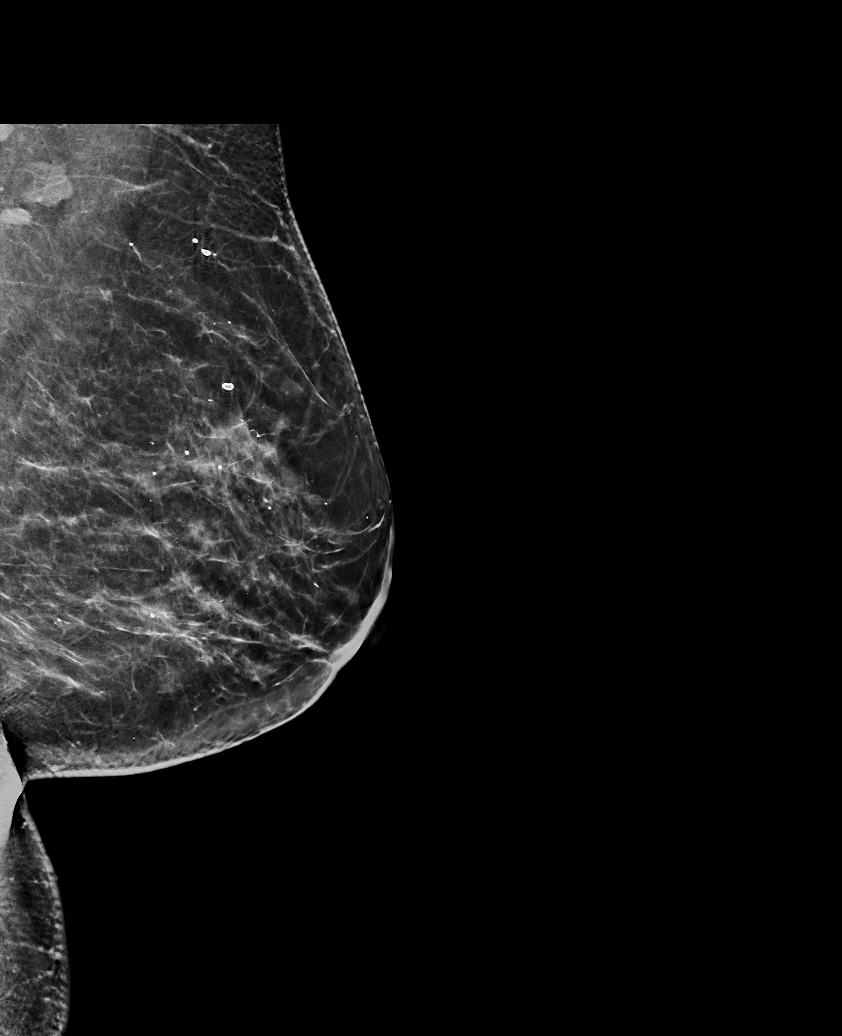

[R MLO synth-2D]
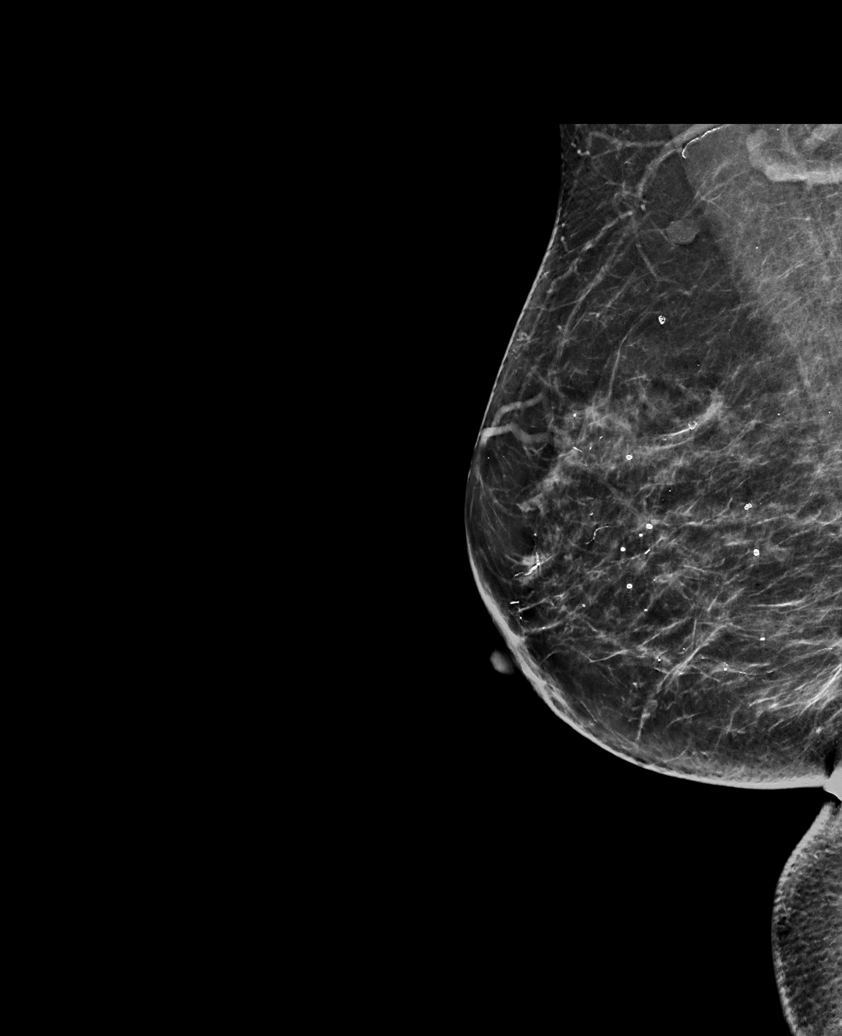

[R CC tomo · tomo slice 33/66.0]
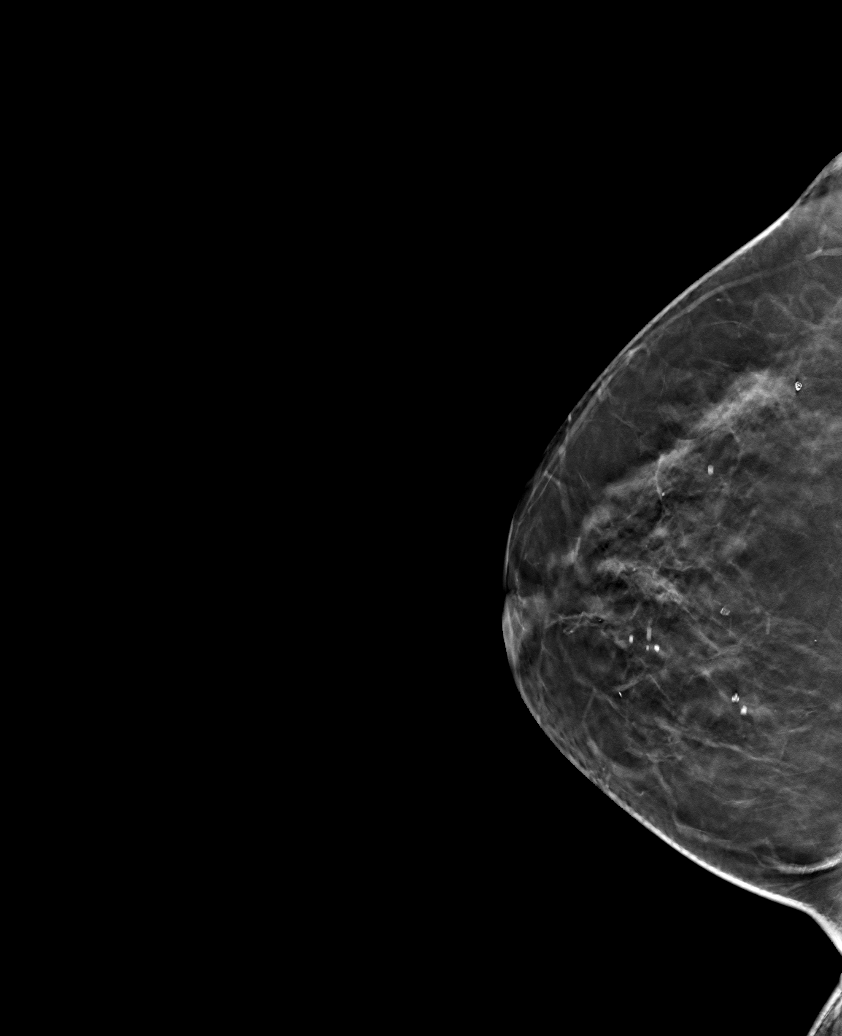

[6 of 30 positions shown; findings below may reference images not displayed]

ACR Breast Density Category b: There are scattered areas of
fibroglandular density.
FINDINGS: There are no findings suspicious for malignancy. Images were
processed with CAD.
IMPRESSION: No mammographic evidence of malignancy. A result letter of this
screening mammogram will be mailed directly to the patient.

RECOMMENDATION:
Screening mammogram in one year. (Code:[id number])

BI-RADS CATEGORY  1: Negative.

## 2022-07-09 LAB — HM DIABETES EYE EXAM

## 2022-08-03 ENCOUNTER — Other Ambulatory Visit: Payer: Self-pay | Admitting: *Deleted

## 2022-08-03 DIAGNOSIS — Z1231 Encounter for screening mammogram for malignant neoplasm of breast: Secondary | ICD-10-CM

## 2022-08-23 ENCOUNTER — Ambulatory Visit
Admission: RE | Admit: 2022-08-23 | Discharge: 2022-08-23 | Disposition: A | Discharge status: Home / Self Care | Payer: PPO | Source: Ambulatory Visit | Attending: *Deleted | Admitting: *Deleted

## 2022-08-23 DIAGNOSIS — Z1231 Encounter for screening mammogram for malignant neoplasm of breast: Secondary | ICD-10-CM | POA: Insufficient documentation

## 2022-09-18 ENCOUNTER — Ambulatory Visit: Payer: PPO | Admitting: Nurse Practitioner

## 2022-11-16 ENCOUNTER — Ambulatory Visit (INDEPENDENT_AMBULATORY_CARE_PROVIDER_SITE_OTHER): Payer: PPO | Admitting: Internal Medicine

## 2022-11-16 ENCOUNTER — Encounter: Payer: Self-pay | Admitting: Internal Medicine

## 2022-11-16 VITALS — BP 136/84 | HR 73 | Temp 97.9°F | Resp 16 | Ht 66.0 in | Wt 168.7 lb

## 2022-11-16 DIAGNOSIS — E039 Hypothyroidism, unspecified: Secondary | ICD-10-CM | POA: Diagnosis not present

## 2022-11-16 DIAGNOSIS — E559 Vitamin D deficiency, unspecified: Secondary | ICD-10-CM

## 2022-11-16 DIAGNOSIS — Z789 Other specified health status: Secondary | ICD-10-CM

## 2022-11-16 DIAGNOSIS — E611 Iron deficiency: Secondary | ICD-10-CM | POA: Diagnosis not present

## 2022-11-16 DIAGNOSIS — E119 Type 2 diabetes mellitus without complications: Secondary | ICD-10-CM | POA: Diagnosis not present

## 2022-11-16 DIAGNOSIS — Z1159 Encounter for screening for other viral diseases: Secondary | ICD-10-CM

## 2022-11-16 DIAGNOSIS — Z13220 Encounter for screening for lipoid disorders: Secondary | ICD-10-CM

## 2022-11-16 DIAGNOSIS — E538 Deficiency of other specified B group vitamins: Secondary | ICD-10-CM

## 2022-11-16 MED ORDER — ACCU-CHEK SOFTCLIX LANCETS MISC: 12 refills | Status: AC

## 2022-11-16 MED ORDER — GLUCOSE BLOOD VI STRP: ORAL_STRIP | 12 refills | Status: AC

## 2022-11-16 NOTE — Progress Notes (Signed)
New Patient Office Visit  Subjective    Patient ID: Catherine Dodson, female    DOB: 1950/11/30  Age: 72 y.o. MRN: [id number]  CC:  Chief Complaint  Patient presents with   Establish Care    HPI Catherine Dodson presents to establish care. She was seeing a provider in [place] but due to cost is establishing here.   Diabetes, Type 2: -First diagnosed 25 years ago -Last A1c 9.5 2018 -Medications: Glipizide 10 mg BID, Metformin XR 1000 mg BID -Had been on Jardiance  -Patient is compliant with the above medications and reports no side effects.  -Checking BG at home: no -Diet: vegetarian  -Eye exam: UTD 3/24 -Foot exam: Due -Microalbumin: Due -Statin: No -PNA vaccine: Uncertain, will discuss at follow up -Denies symptoms of hypoglycemia, polyuria, polydipsia, foot ulcers/trauma. Does have occasional numbness extremities, foot ulcers/trauma.   Hypothyroidism: -Medications: NP thyroid 90 mg  -Patient is compliant with the above medication (s) at the above dose and reports no medication side effects.  -Denies weight changes, cold./heat intolerance, skin changes, anxiety/palpitations  -Last TSH: 1.27 2018  Iron deficiency/vitamin D deficiency/vitamin B12 deficiency: -Vegetarian diet -Is on a lot of supplements, does take vitamin B12 500 mcg twice a day, vitamin D 5000 IUs 4 times a week and time-released iron 25 mg daily  Health Maintenance: -Blood work due -Mammogram 4/24 negative  -Cologuard 2/23 negative   Outpatient Encounter Medications as of 11/16/2022  Medication Sig   glipiZIDE (GLUCOTROL) 10 MG tablet Take 10 mg by mouth 2 (two) times daily.   glucose blood test strip Use as instructed to check blood sugar up to three times daily. E11.9   metFORMIN (GLUCOPHAGE-XR) 500 MG 24 hr tablet TAKE 2 TABLETS (1,000 MG TOTAL) BY MOUTH 2 (TWO) TIMES DAILY.   thyroid (NP THYROID) 90 MG tablet Take 1 tablet (90 mg total) by mouth daily.   [DISCONTINUED] empagliflozin  (JARDIANCE) 10 MG TABS tablet Take 10 mg by mouth daily.   [DISCONTINUED] UNABLE TO FIND Numerous OTC supplements- see scanned-in list from 02/01/15   No facility-administered encounter medications on file as of 11/16/2022.    Past Medical History:  Diagnosis Date   Allergy penicillin, novocaine, demerol, latex   Blood transfusion without reported diagnosis 1979   Cataract 2014   Had surgery 2015   Diabetes mellitus without complication (HCC) Type 2   Heart murmur 2018   Mild, had cardiac workup   History of MRSA infection    Thyroid disease     Past Surgical History:  Procedure Laterality Date   CATARACT EXTRACTION     2015   CESAREAN SECTION     CESAREAN SECTION  1986, 1992   DILATION AND CURETTAGE OF UTERUS     EYE SURGERY  1995   LIPOMA EXCISION      Family History  Problem Relation Age of Onset   Hyperlipidemia Mother    Hypertension Mother    Heart disease Mother    Lung cancer Father    Hypertension Sister    Arthritis Maternal Grandmother    Diabetes Maternal Grandmother    Miscarriages / Stillbirths Maternal Grandmother    Stroke Maternal Grandmother    Alcohol abuse Maternal Grandmother    Heart disease Maternal Grandfather    Breast cancer Neg Hx     Social History   Socioeconomic History   Marital status: Married    Spouse name: Not on file   Number of children: Not on file  Years of education: Not on file   Highest education level: Not on file  Occupational History   Not on file  Tobacco Use   Smoking status: Former    Current packs/day: 0.00    Average packs/day: 0.3 packs/day for 2.0 years (0.5 ttl pk-yrs)    Types: Cigarettes    Quit date: 05/01/1970    Years since quitting: 52.5   Smokeless tobacco: Never   Tobacco comments:    1-2 years during college, light use  Vaping Use   Vaping status: Never Used  Substance and Sexual Activity   Alcohol use: No   Drug use: No   Sexual activity: Not Currently    Partners: Male  Other Topics  Concern   Not on file  Social History Narrative   Not on file   Social Determinants of Health   Financial Resource Strain: Not on file  Food Insecurity: Not on file  Transportation Needs: Not on file  Physical Activity: Not on file  Stress: Not on file  Social Connections: Not on file  Intimate Partner Violence: Not on file    Review of Systems  All other systems reviewed and are negative.       Objective    BP 136/84   Pulse 73   Temp 97.9 F (36.6 C)   Resp 16   Ht 5\' 6"  (1.676 m)   Wt 168 lb 11.2 oz (76.5 kg)   SpO2 98%   BMI 27.23 kg/m   Physical Exam Constitutional:      Appearance: Normal appearance.  HENT:     Head: Normocephalic and atraumatic.     Mouth/Throat:     Mouth: Mucous membranes are moist.     Pharynx: Oropharynx is clear.  Eyes:     Extraocular Movements: Extraocular movements intact.     Conjunctiva/sclera: Conjunctivae normal.     Pupils: Pupils are equal, round, and reactive to light.  Neck:     Comments: No thyromegaly Cardiovascular:     Rate and Rhythm: Normal rate and regular rhythm.     Pulses:          Dorsalis pedis pulses are 2+ on the right side and 2+ on the left side.  Pulmonary:     Effort: Pulmonary effort is normal.     Breath sounds: Normal breath sounds.  Musculoskeletal:     Cervical back: No tenderness.     Right lower leg: No edema.     Left lower leg: No edema.     Right foot: Normal range of motion. No deformity, bunion, Charcot foot, foot drop or prominent metatarsal heads.     Left foot: Normal range of motion. No deformity, bunion, Charcot foot, foot drop or prominent metatarsal heads.  Feet:     Right foot:     Protective Sensation: 6 sites tested.  6 sites sensed.     Skin integrity: Skin integrity normal.     Toenail Condition: Right toenails are normal.     Left foot:     Protective Sensation: 6 sites tested.  6 sites sensed.     Skin integrity: Skin integrity normal.     Toenail Condition: Left  toenails are normal.  Lymphadenopathy:     Cervical: No cervical adenopathy.  Skin:    General: Skin is warm and dry.  Neurological:     General: No focal deficit present.     Mental Status: She is alert. Mental status is at baseline.  Psychiatric:  Mood and Affect: Mood normal.        Behavior: Behavior normal.         Assessment & Plan:   1. Type 2 diabetes mellitus without complication, unspecified whether long term insulin use (HCC): Uncertain lat A1c, will recheck.  Patient is currently on glipizide 10 mg twice daily and metformin extended release at 1000 mg twice daily.  Patient had been on an SGLT2 in the past but was too expensive.  Will recheck labs, urine microalbumin and foot exam today.  Lancets and test trips refilled.  - Urine Microalbumin w/creat. ratio - COMPLETE METABOLIC PANEL WITH GFR - Hemoglobin A1C - glucose blood test strip; Use as instructed to check blood sugar up to three times daily. E11.9  Dispense: 100 each; Refill: 12 - Accu-Chek Softclix Lancets lancets; Use as instructed  Dispense: 100 each; Refill: 12 - HM Diabetes Foot Exam  2. Hypothyroidism, unspecified type: Recheck TSH today. Currently on NP thyroid 90 mcg.   - TSH  3. Vegetarian/Low iron/Vitamin D deficiency/Vitamin B12 deficiency: On supplements, will recheck vitamin levels.   - B12 and Folate Panel - Fe+TIBC+Fer - Vitamin D (25 hydroxy)  4. Lipid screening/Encounter for hepatitis C screening test for low risk patient: Screening tests ordered.   - Lipid panel - Hepatitis C Antibody   Return if symptoms worsen or fail to improve, for AWV.   Margarita Mail, DO

## 2022-11-17 LAB — COMPLETE METABOLIC PANEL WITH GFR
AG Ratio: 1.9 (calc) (ref 1.0–2.5)
ALT: 18 U/L (ref 6–29)
AST: 17 U/L (ref 10–35)
Albumin: 4.5 g/dL (ref 3.6–5.1)
Alkaline phosphatase (APISO): 91 U/L (ref 37–153)
BUN: 12 mg/dL (ref 7–25)
CO2: 28 mmol/L (ref 20–32)
Calcium: 10.2 mg/dL (ref 8.6–10.4)
Chloride: 102 mmol/L (ref 98–110)
Creat: 0.66 mg/dL (ref 0.60–1.00)
Globulin: 2.4 g/dL (calc) (ref 1.9–3.7)
Glucose, Bld: 223 mg/dL — ABNORMAL HIGH (ref 65–99)
Potassium: 5 mmol/L (ref 3.5–5.3)
Sodium: 139 mmol/L (ref 135–146)
Total Bilirubin: 0.5 mg/dL (ref 0.2–1.2)
Total Protein: 6.9 g/dL (ref 6.1–8.1)
eGFR: 94 mL/min/{1.73_m2} (ref >=60)

## 2022-11-17 LAB — LIPID PANEL
Cholesterol: 205 mg/dL — ABNORMAL HIGH (ref <200)
HDL: 56 mg/dL (ref >=50)
LDL Cholesterol (Calc): 122 mg/dL (calc) — ABNORMAL HIGH
Non-HDL Cholesterol (Calc): 149 mg/dL (calc) — ABNORMAL HIGH (ref <130)
Total CHOL/HDL Ratio: 3.7 (calc) (ref <5.0)
Triglycerides: 157 mg/dL — ABNORMAL HIGH (ref <150)

## 2022-11-17 LAB — CBC WITH DIFFERENTIAL/PLATELET
Absolute Monocytes: 331 cells/uL (ref 200–950)
Basophils Absolute: 29 cells/uL (ref 0–200)
Basophils Relative: 0.5 %
Eosinophils Absolute: 103 cells/uL (ref 15–500)
Eosinophils Relative: 1.8 %
HCT: 40.3 % (ref 35.0–45.0)
Hemoglobin: 13.1 g/dL (ref 11.7–15.5)
Lymphs Abs: 1744 cells/uL (ref 850–3900)
MCH: 27.1 pg (ref 27.0–33.0)
MCHC: 32.5 g/dL (ref 32.0–36.0)
MCV: 83.3 fL (ref 80.0–100.0)
MPV: 12 fL (ref 7.5–12.5)
Monocytes Relative: 5.8 %
Neutro Abs: 3494 cells/uL (ref 1500–7800)
Neutrophils Relative %: 61.3 %
Platelets: 238 10*3/uL (ref 140–400)
RBC: 4.84 10*6/uL (ref 3.80–5.10)
RDW: 13.6 % (ref 11.0–15.0)
Total Lymphocyte: 30.6 %
WBC: 5.7 10*3/uL (ref 3.8–10.8)

## 2022-11-17 LAB — VITAMIN D 25 HYDROXY (VIT D DEFICIENCY, FRACTURES): Vit D, 25-Hydroxy: 64 ng/mL (ref 30–100)

## 2022-11-17 LAB — IRON,TIBC AND FERRITIN PANEL
%SAT: 21 % (calc) (ref 16–45)
Ferritin: 9 ng/mL — ABNORMAL LOW (ref 16–288)
Iron: 82 ug/dL (ref 45–160)
TIBC: 385 mcg/dL (calc) (ref 250–450)

## 2022-11-17 LAB — MICROALBUMIN / CREATININE URINE RATIO
Creatinine, Urine: 25 mg/dL (ref 20–275)
Microalb Creat Ratio: 8 mg/g creat (ref <30)
Microalb, Ur: 0.2 mg/dL

## 2022-11-17 LAB — B12 AND FOLATE PANEL
Folate: 20.9 ng/mL
Vitamin B-12: 826 pg/mL (ref 200–1100)

## 2022-11-17 LAB — HEMOGLOBIN A1C
Hgb A1c MFr Bld: 10.1 % of total Hgb — ABNORMAL HIGH (ref <5.7)
Mean Plasma Glucose: 243 mg/dL
eAG (mmol/L): 13.5 mmol/L

## 2022-11-17 LAB — TSH: TSH: 0.7 mIU/L (ref 0.40–4.50)

## 2022-11-17 LAB — HEPATITIS C ANTIBODY: Hepatitis C Ab: NONREACTIVE

## 2022-11-17 MED ORDER — THYROID 90 MG PO TABS: 90.0000 mg | ORAL_TABLET | Freq: Every day | ORAL | 1 refills | Status: DC

## 2022-11-17 NOTE — Addendum Note (Signed)
Addended by: Margarita Mail on: 11/17/2022 01:49 PM   Modules accepted: Orders

## 2022-11-19 NOTE — Progress Notes (Unsigned)
Established Patient Office Visit  Subjective    Patient ID: Catherine Dodson, female    DOB: [birth year]  Age: 72 y.o. MRN: [id number]  CC:  No chief complaint on file.   HPI Catherine Dodson presents to discuss lab results.  Diabetes, Type 2: -First diagnosed 25 years ago -Last A1c 10.1 7/24 -Medications: Glipizide 10 mg BID, Metformin XR 1000 mg BID -Had been on Jardiance  -Patient is compliant with the above medications and reports no side effects.  -Checking BG at home: no -Diet: vegetarian  -Eye exam: UTD 3/24 -Foot exam: Due -Microalbumin: UTD 7/24 -Statin: No -PNA vaccine: Uncertain, will discuss at follow up -Denies symptoms of hypoglycemia, polyuria, polydipsia, foot ulcers/trauma. Does have occasional numbness extremities, foot ulcers/trauma.   HLD:  -Medications: Nothing -Last lipid panel: Lipid Panel     Component Value Date/Time   CHOL 205 (H) 11/16/2022 1200   TRIG 157 (H) 11/16/2022 1200   HDL 56 11/16/2022 1200   CHOLHDL 3.7 11/16/2022 1200   VLDL 30.8 06/08/2016 1151   LDLCALC 122 (H) 11/16/2022 1200   The 10-year ASCVD risk score (Arnett DK, et al., 2019) is: 21.7%   Values used to calculate the score:     Age: 89 years     Sex: Female     Is Non-Hispanic African American: No     Diabetic: Yes     Tobacco smoker: No     Systolic Blood Pressure: 136 mmHg     Is BP treated: No     HDL Cholesterol: 56 mg/dL     Total Cholesterol: 205 mg/dL  Hypothyroidism: -Medications: NP thyroid 90 mg  -Patient is compliant with the above medication (s) at the above dose and reports no medication side effects.  -Denies weight changes, cold./heat intolerance, skin changes, anxiety/palpitations  -Last TSH: 7/24 0.70  Iron deficiency/vitamin D deficiency/vitamin B12 deficiency: -Vegetarian diet -Is on a lot of supplements, does take vitamin B12 500 mcg twice a day, vitamin D 5000 IUs 4 times a week and time-released iron 25 mg daily  Health  Maintenance: -Blood work due -Mammogram 4/24 negative  -Cologuard 2/23 negative   Outpatient Encounter Medications as of 11/20/2022  Medication Sig   Accu-Chek Softclix Lancets lancets Use as instructed   glipiZIDE (GLUCOTROL) 10 MG tablet Take 10 mg by mouth 2 (two) times daily.   glucose blood test strip Use as instructed to check blood sugar up to three times daily. E11.9   metFORMIN (GLUCOPHAGE-XR) 500 MG 24 hr tablet TAKE 2 TABLETS (1,000 MG TOTAL) BY MOUTH 2 (TWO) TIMES DAILY.   thyroid (NP THYROID) 90 MG tablet Take 1 tablet (90 mg total) by mouth daily.   No facility-administered encounter medications on file as of 11/20/2022.    Past Medical History:  Diagnosis Date   Allergy penicillin, novocaine, demerol, latex   Blood transfusion without reported diagnosis 1979   Cataract 2014   Had surgery 2015   Diabetes mellitus without complication (HCC) Type 2   Heart murmur 2018   Mild, had cardiac workup   History of MRSA infection    Thyroid disease     Past Surgical History:  Procedure Laterality Date   CATARACT EXTRACTION     2015   CESAREAN SECTION     CESAREAN SECTION  1986, 1992   DILATION AND CURETTAGE OF UTERUS     EYE SURGERY  1995   LIPOMA EXCISION      Family History  Problem Relation Age  of Onset   Hyperlipidemia Mother    Hypertension Mother    Heart disease Mother    Lung cancer Father    Hypertension Sister    Arthritis Maternal Grandmother    Diabetes Maternal Grandmother    Miscarriages / Stillbirths Maternal Grandmother    Stroke Maternal Grandmother    Alcohol abuse Maternal Grandmother    Heart disease Maternal Grandfather    Breast cancer Neg Hx     Social History   Socioeconomic History   Marital status: Married    Spouse name: Not on file   Number of children: Not on file   Years of education: Not on file   Highest education level: Not on file  Occupational History   Not on file  Tobacco Use   Smoking status: Former    Current  packs/day: 0.00    Average packs/day: 0.3 packs/day for 2.0 years (0.5 ttl pk-yrs)    Types: Cigarettes    Quit date: 05/01/1970    Years since quitting: 52.5   Smokeless tobacco: Never   Tobacco comments:    1-2 years during college, light use  Vaping Use   Vaping status: Never Used  Substance and Sexual Activity   Alcohol use: No   Drug use: No   Sexual activity: Not Currently    Partners: Male  Other Topics Concern   Not on file  Social History Narrative   Not on file   Social Determinants of Health   Financial Resource Strain: Not on file  Food Insecurity: Not on file  Transportation Needs: Not on file  Physical Activity: Not on file  Stress: Not on file  Social Connections: Not on file  Intimate Partner Violence: Not on file    Review of Systems  All other systems reviewed and are negative.       Objective    There were no vitals taken for this visit.  Physical Exam Constitutional:      Appearance: Normal appearance.  HENT:     Head: Normocephalic and atraumatic.     Mouth/Throat:     Mouth: Mucous membranes are moist.     Pharynx: Oropharynx is clear.  Eyes:     Extraocular Movements: Extraocular movements intact.     Conjunctiva/sclera: Conjunctivae normal.     Pupils: Pupils are equal, round, and reactive to light.  Neck:     Comments: No thyromegaly Cardiovascular:     Rate and Rhythm: Normal rate and regular rhythm.     Pulses:          Dorsalis pedis pulses are 2+ on the right side and 2+ on the left side.  Pulmonary:     Effort: Pulmonary effort is normal.     Breath sounds: Normal breath sounds.  Musculoskeletal:     Cervical back: No tenderness.     Right lower leg: No edema.     Left lower leg: No edema.     Right foot: Normal range of motion. No deformity, bunion, Charcot foot, foot drop or prominent metatarsal heads.     Left foot: Normal range of motion. No deformity, bunion, Charcot foot, foot drop or prominent metatarsal heads.   Feet:     Right foot:     Protective Sensation: 6 sites tested.  6 sites sensed.     Skin integrity: Skin integrity normal.     Toenail Condition: Right toenails are normal.     Left foot:     Protective Sensation: 6 sites  tested.  6 sites sensed.     Skin integrity: Skin integrity normal.     Toenail Condition: Left toenails are normal.  Lymphadenopathy:     Cervical: No cervical adenopathy.  Skin:    General: Skin is warm and dry.  Neurological:     General: No focal deficit present.     Mental Status: She is alert. Mental status is at baseline.  Psychiatric:        Mood and Affect: Mood normal.        Behavior: Behavior normal.         Assessment & Plan:   1. Type 2 diabetes mellitus without complication, unspecified whether long term insulin use (HCC): Uncertain lat A1c, will recheck.  Patient is currently on glipizide 10 mg twice daily and metformin extended release at 1000 mg twice daily.  Patient had been on an SGLT2 in the past but was too expensive.  Will recheck labs, urine microalbumin and foot exam today.  Lancets and test trips refilled.  - Urine Microalbumin w/creat. ratio - COMPLETE METABOLIC PANEL WITH GFR - Hemoglobin A1C - glucose blood test strip; Use as instructed to check blood sugar up to three times daily. E11.9  Dispense: 100 each; Refill: 12 - Accu-Chek Softclix Lancets lancets; Use as instructed  Dispense: 100 each; Refill: 12 - [facility] Diabetes Foot Exam  2. Hypothyroidism, unspecified type: Recheck TSH today. Currently on NP thyroid 90 mcg.   - TSH  3. Vegetarian/Low iron/Vitamin D deficiency/Vitamin B12 deficiency: On supplements, will recheck vitamin levels.   - B12 and Folate Panel - Fe+TIBC+Fer - Vitamin D (25 hydroxy)  4. Lipid screening/Encounter for hepatitis C screening test for low risk patient: Screening tests ordered.   - Lipid panel - Hepatitis C Antibody   No follow-ups on file.   Margarita Mail, DO

## 2022-11-20 ENCOUNTER — Ambulatory Visit (INDEPENDENT_AMBULATORY_CARE_PROVIDER_SITE_OTHER): Payer: PPO | Admitting: Internal Medicine

## 2022-11-20 ENCOUNTER — Encounter: Payer: Self-pay | Admitting: Internal Medicine

## 2022-11-20 VITALS — BP 136/68 | HR 102 | Temp 98.0°F | Resp 18 | Ht 66.0 in | Wt 168.0 lb

## 2022-11-20 DIAGNOSIS — Z91012 Allergy to eggs: Secondary | ICD-10-CM

## 2022-11-20 DIAGNOSIS — Z789 Other specified health status: Secondary | ICD-10-CM | POA: Diagnosis not present

## 2022-11-20 DIAGNOSIS — E1165 Type 2 diabetes mellitus with hyperglycemia: Secondary | ICD-10-CM

## 2022-11-20 DIAGNOSIS — R790 Abnormal level of blood mineral: Secondary | ICD-10-CM

## 2022-11-20 DIAGNOSIS — E782 Mixed hyperlipidemia: Secondary | ICD-10-CM | POA: Diagnosis not present

## 2022-11-20 MED ORDER — TIRZEPATIDE 2.5 MG/0.5ML ~~LOC~~ SOAJ: 2.5000 mg | SUBCUTANEOUS | 0 refills | Status: DC

## 2022-11-21 ENCOUNTER — Telehealth: Payer: Self-pay | Admitting: Internal Medicine

## 2022-11-21 ENCOUNTER — Other Ambulatory Visit: Payer: Self-pay | Admitting: Internal Medicine

## 2022-11-21 ENCOUNTER — Telehealth: Payer: Self-pay

## 2022-11-21 DIAGNOSIS — E119 Type 2 diabetes mellitus without complications: Secondary | ICD-10-CM

## 2022-11-21 DIAGNOSIS — E039 Hypothyroidism, unspecified: Secondary | ICD-10-CM

## 2022-11-21 MED ORDER — LEVOTHYROXINE SODIUM 150 MCG PO TABS: 150.0000 ug | ORAL_TABLET | Freq: Every day | ORAL | 0 refills | Status: DC

## 2022-11-21 NOTE — Telephone Encounter (Signed)
Copied from CRM [id number]. Topic: General - Other >> Nov 21, 2022  9:52 AM Phill Myron wrote: Please call Catherine Dodson in regards to getting a less expensive medication, currently taking,  thyroid (NP THYROID) 90 MG tablet, which is expensive and not covered by her insurance. Please advise Thank you

## 2022-11-21 NOTE — Telephone Encounter (Signed)
Wrong pharmacy, re sent

## 2022-11-27 ENCOUNTER — Telehealth: Payer: Self-pay | Admitting: Internal Medicine

## 2022-11-27 NOTE — Telephone Encounter (Signed)
Pt is calling saying the Opal Sidles is going to be too expensive for her to take once she hits the donut hole.  She wants to know if she can be prescribed something different.  CB#  [phone]

## 2022-11-29 ENCOUNTER — Other Ambulatory Visit: Payer: PPO

## 2022-11-29 ENCOUNTER — Inpatient Hospital Stay: Payer: PPO | Attending: Internal Medicine | Admitting: Internal Medicine

## 2022-11-29 ENCOUNTER — Encounter: Payer: Self-pay | Admitting: Internal Medicine

## 2022-11-29 VITALS — BP 143/64 | HR 82 | Temp 98.2°F | Ht 66.0 in | Wt 169.4 lb

## 2022-11-29 DIAGNOSIS — R5383 Other fatigue: Secondary | ICD-10-CM | POA: Diagnosis not present

## 2022-11-29 DIAGNOSIS — Z87891 Personal history of nicotine dependence: Secondary | ICD-10-CM

## 2022-11-29 DIAGNOSIS — E611 Iron deficiency: Secondary | ICD-10-CM | POA: Diagnosis not present

## 2022-11-29 DIAGNOSIS — R790 Abnormal level of blood mineral: Secondary | ICD-10-CM | POA: Insufficient documentation

## 2022-11-29 NOTE — Assessment & Plan Note (Addendum)
#   LOW ferritin-however hemoglobin normal.  Iron saturation normal.  Patient not too symptomatic.  Recommend continue oral iron; patient without from calcium tablets.  Recommended vitamin C/amla.  Patient had previous iron infusions without any reactions.  #Etiology of iron deficiency: Unclear; I had a long discussion with the patient regarding multiple etiologies of anemia including iron deficiency-which is mainly caused by blood loss/malabsorption.  Patient declined endoscopies.  Cologuard negative recently.  # Diabetes poorly controlled [Hb A1C- 10]- defer to PCP.   Thank you Dr. Caralee Ates for allowing me to participate in the care of your pleasant patient. Please do not hesitate to contact me with questions or concerns in the interim.  Patient is comfortable follow-up with PCP; however if continues to have worsening symptoms/she will call us for consideration of IV iron infusions.   # DISPOSITION: # no labs today # Follow up as needed- Dr.B

## 2022-11-29 NOTE — Progress Notes (Signed)
No concerns today 

## 2022-11-29 NOTE — Progress Notes (Signed)
[facility] CONSULT NOTE  Patient Care Team: Margarita Mail, DO as PCP - General (Internal Medicine) Earna Coder, MD as Consulting Physician (Oncology)  CHIEF COMPLAINTS/PURPOSE OF CONSULTATION:LOW ferrtin   HEMATOLOGY HISTORY  # ANEMIA [Hb; MCV-platelets- WBC; Iron sat; ferritin;  GFR- CT/US- ;  EGD/colonoscopy-   Latest Reference Range & Units 11/16/22 12:00  Iron 45 - 160 mcg/dL 82  TIBC 409 - 811 mcg/dL (calc) 914  %SAT 16 - 45 % (calc) 21  Ferritin 16 - 288 ng/mL 9 (L)  Folate ng/mL 20.9  (L): Data is abnormally low  HISTORY OF PRESENTING ILLNESS: Patient ambulating-independently.   Alone.  Catherine Dodson 72 y.o.  female pleasant patient was been referred to [place] for further evaluation of low ferritin.   Patient complains of mild fatigue.  Otherwise denies any unusual shortness of breath or cough or dizziness.  Blood in stools: none; cologuard- WNL; declines- colonoscopy- none; EGD- none Blood in urine: none;  Difficulty swallowing:none;  Change of bowel movement/constipation:none;  Prior blood transfusion:  remote; miscarriage [1991] Kidney/Liver disease: none  Alcohol: none  Bariatric surgery: none   Vaginal bleeding: none Prior bone marrow biopsy: none  Oral iron: yes- for 1 year.  Prior IV iron infusions: yes- in [place]; vegan.    Review of Systems  Constitutional:  Positive for malaise/fatigue. Negative for chills, diaphoresis, fever and weight loss.  HENT:  Negative for nosebleeds and sore throat.   Eyes:  Negative for double vision.  Respiratory:  Negative for cough, hemoptysis, sputum production, shortness of breath and wheezing.   Cardiovascular:  Negative for chest pain, palpitations, orthopnea and leg swelling.  Gastrointestinal:  Negative for abdominal pain, blood in stool, constipation, diarrhea, heartburn, melena, nausea and vomiting.  Genitourinary:  Negative for dysuria, frequency and urgency.  Musculoskeletal:   Negative for back pain and joint pain.  Skin: Negative.  Negative for itching and rash.  Neurological:  Negative for dizziness, tingling, focal weakness, weakness and headaches.  Endo/Heme/Allergies:  Does not bruise/bleed easily.  Psychiatric/Behavioral:  Negative for depression. The patient is not nervous/anxious and does not have insomnia.      MEDICAL HISTORY:  Past Medical History:  Diagnosis Date   Allergy penicillin, novocaine, demerol, latex   Blood transfusion without reported diagnosis 1979   Cataract 2014   Had surgery 2015   Diabetes mellitus without complication (HCC) Type 2   Heart murmur 2018   Mild, had cardiac workup   History of MRSA infection    Thyroid disease     SURGICAL HISTORY: Past Surgical History:  Procedure Laterality Date   CATARACT EXTRACTION     2015   CESAREAN SECTION     CESAREAN SECTION  1986, 1992   DILATION AND CURETTAGE OF UTERUS     EYE SURGERY  1995   LIPOMA EXCISION  2011    SOCIAL HISTORY: Social History   Socioeconomic History   Marital status: Married    Spouse name: Not on file   Number of children: Not on file   Years of education: Not on file   Highest education level: Not on file  Occupational History   Not on file  Tobacco Use   Smoking status: Former    Current packs/day: 0.00    Average packs/day: 0.3 packs/day for 2.0 years (0.5 ttl pk-yrs)    Types: Cigarettes    Quit date: 05/01/1970    Years since quitting: 52.6   Smokeless tobacco: Never   Tobacco comments:  1-2 years during college, light use  Vaping Use   Vaping status: Never Used  Substance and Sexual Activity   Alcohol use: No   Drug use: No   Sexual activity: Not Currently    Partners: Male  Other Topics Concern   Not on file  Social History Narrative   Not on file   Social Determinants of Health   Financial Resource Strain: Not on file  Food Insecurity: Not on file  Transportation Needs: Not on file  Physical Activity: Not on file   Stress: Not on file  Social Connections: Not on file  Intimate Partner Violence: Not on file    FAMILY HISTORY: Family History  Problem Relation Age of Onset   Hyperlipidemia Mother    Hypertension Mother    Heart disease Mother    Lung cancer Father    Hypertension Sister    Arthritis Maternal Grandmother    Diabetes Maternal Grandmother    Miscarriages / Stillbirths Maternal Grandmother    Stroke Maternal Grandmother    Alcohol abuse Maternal Grandmother    Heart disease Maternal Grandfather    Breast cancer Neg Hx     ALLERGIES:  is allergic to ceclor [cefaclor], codeine, meperidine, morphine and codeine, penicillins, xylocaine [lidocaine hcl], latex, and nickel.  MEDICATIONS:  Current Outpatient Medications  Medication Sig Dispense Refill   Accu-Chek Softclix Lancets lancets Use as instructed 100 each 12   glipiZIDE (GLUCOTROL) 10 MG tablet Take 10 mg by mouth 2 (two) times daily.     glucose blood test strip Use as instructed to check blood sugar up to three times daily. E11.9 100 each 12   levothyroxine (SYNTHROID) 150 MCG tablet Take 1 tablet (150 mcg total) by mouth daily. 90 tablet 0   metFORMIN (GLUCOPHAGE-XR) 500 MG 24 hr tablet TAKE 2 TABLETS (1,000 MG TOTAL) BY MOUTH 2 (TWO) TIMES DAILY. 120 tablet 0   tirzepatide (MOUNJARO) 2.5 MG/0.5ML Pen Inject 2.5 mg into the skin once a week. 2 mL 0   No current facility-administered medications for this visit.     PHYSICAL EXAMINATION:   Vitals:   11/29/22 1058  BP: (!) 143/64  Pulse: 82  Temp: 98.2 F (36.8 C)  SpO2: 97%   Filed Weights   11/29/22 1058  Weight: 169 lb 6.4 oz (76.8 kg)    Physical Exam Vitals and nursing note reviewed.  HENT:     Head: Normocephalic and atraumatic.     Mouth/Throat:     Pharynx: Oropharynx is clear.  Eyes:     Extraocular Movements: Extraocular movements intact.     Pupils: Pupils are equal, round, and reactive to light.  Cardiovascular:     Rate and Rhythm: Normal  rate and regular rhythm.  Pulmonary:     Comments: Decreased breath sounds bilaterally.  Abdominal:     Palpations: Abdomen is soft.  Musculoskeletal:        General: Normal range of motion.     Cervical back: Normal range of motion.  Skin:    General: Skin is warm.  Neurological:     General: No focal deficit present.     Mental Status: She is alert and oriented to person, place, and time.  Psychiatric:        Behavior: Behavior normal.        Judgment: Judgment normal.      LABORATORY DATA:  I have reviewed the data as listed Lab Results  Component Value Date   WBC 5.7 11/16/2022  HGB 13.1 11/16/2022   HCT 40.3 11/16/2022   MCV 83.3 11/16/2022   PLT 238 11/16/2022   Recent Labs    11/16/22 1200  NA 139  K 5.0  CL 102  CO2 28  GLUCOSE 223*  BUN 12  CREATININE 0.66  CALCIUM 10.2  PROT 6.9  AST 17  ALT 18  BILITOT 0.5     No results found.  ASSESSMENT & PLAN:   Low ferritin # LOW ferritin-however hemoglobin normal.  Iron saturation normal.  Patient not too symptomatic.  Recommend continue oral iron; patient without from calcium tablets.  Recommended vitamin C/amla.  Patient had previous iron infusions without any reactions.  #Etiology of iron deficiency: Unclear; I had a long discussion with the patient regarding multiple etiologies of anemia including iron deficiency-which is mainly caused by blood loss/malabsorption.  Patient declined endoscopies.  Cologuard negative recently.  # Diabetes poorly controlled [Hb A1C- 10]- defer to PCP.   Thank you Dr. Caralee Ates for allowing me to participate in the care of your pleasant patient. Please do not hesitate to contact me with questions or concerns in the interim.  Patient is comfortable follow-up with PCP; however if continues to have worsening symptoms/she will call us for consideration of IV iron infusions.   # DISPOSITION: # no labs today # Follow up as needed- Dr.B    All questions were answered. The  patient knows to call the clinic with any problems, questions or concerns.    Earna Coder, MD 11/29/2022 11:53 AM

## 2022-11-30 ENCOUNTER — Other Ambulatory Visit: Payer: Self-pay | Admitting: Internal Medicine

## 2022-11-30 DIAGNOSIS — E1165 Type 2 diabetes mellitus with hyperglycemia: Secondary | ICD-10-CM

## 2022-12-11 ENCOUNTER — Other Ambulatory Visit: Payer: PPO | Admitting: Pharmacist

## 2022-12-11 ENCOUNTER — Encounter: Payer: Self-pay | Admitting: Pharmacist

## 2022-12-11 ENCOUNTER — Other Ambulatory Visit: Payer: Self-pay | Admitting: Internal Medicine

## 2022-12-11 DIAGNOSIS — E1165 Type 2 diabetes mellitus with hyperglycemia: Secondary | ICD-10-CM

## 2022-12-11 MED ORDER — FREESTYLE LIBRE 3 SENSOR MISC: 1.0000 | 3 refills | Status: DC

## 2022-12-11 NOTE — Progress Notes (Signed)
12/11/2022 Name: Catherine Dodson MRN: [id number] DOB: 1950/08/12  Chief Complaint  Patient presents with   Medication Management   Medication Assistance    Catherine Dodson is a 72 y.o. year old female who presented for a telephone visit.   They were referred to the pharmacist by their PCP for assistance in managing diabetes and medication access.    Subjective:  Care Team: Primary Care Provider: Margarita Mail, DO ; Next Scheduled Visit: 02/20/2023  Medication Access/Adherence  Current Pharmacy:  [id number] - [place], [place] - [address] [place] [address] Phone: [phone] Fax: [phone]   Patient reports affordability concerns with their medications: Yes  Patient reports access/transportation concerns to their pharmacy: No  Patient reports adherence concerns with their medications:  No    Reports Greggory Keen is unaffordable as she is concerned about entering the coverage gap of her Medicare prescription coverage   Diabetes:  Current medications:  - glipizide 10 mg twice daily before meals - metformin ER 500 mg - 2 tablets (1000 mg) twice daily  Denies checking home blood sugar recently - Reports has home blood sugar monitor, but needs to pick up prescriptions for test strips from pharmacy - Patient interested in using continuous glucose monitoring (CGM) if available through her health plan  Note patient has a smartphone, which she would use in place of the reader device   Patient denies hypoglycemic s/sx including dizziness, shakiness, sweating.   Attributes recent elevated blood sugar/A1C result to poor eating habits while she was caring for her grandson (eating out and eating lots of pizza) - Reports plans to restart nutrition plan that worked well for her in the past  "Eat to live" - Note patient is vegan - Notes that PCP placed referral for her to dietitian, but prefers to hold off at this time   Current  physical activity: reports planning to start going to the gym  Current medication access support: none   Objective:  Lab Results  Component Value Date   HGBA1C 10.1 (H) 11/16/2022    Lab Results  Component Value Date   CREATININE 0.66 11/16/2022   BUN 12 11/16/2022   NA 139 11/16/2022   K 5.0 11/16/2022   CL 102 11/16/2022   CO2 28 11/16/2022    Medications      Reviewed by Manuela Neptune, RPH-CPP (Pharmacist) on 12/11/22 at 1226  Med List Status: <None>   Medication Order Taking? Sig Documenting Provider Last Dose Status Informant  Accu-Chek Softclix Lancets lancets [id number]  Use as instructed Margarita Mail, DO  Active   glipiZIDE (GLUCOTROL) 10 MG tablet [id number] Yes Take 10 mg by mouth 2 (two) times daily. [provider] Taking Active   glucose blood test strip [id number]  Use as instructed to check blood sugar up to three times daily. E11.9 Margarita Mail, DO  Active   levothyroxine (SYNTHROID) 150 MCG tablet [id number]  Take 1 tablet (150 mcg total) by mouth daily. Margarita Mail, DO  Active   metFORMIN (GLUCOPHAGE-XR) 500 MG 24 hr tablet [id number] Yes TAKE 2 TABLETS (1,000 MG TOTAL) BY MOUTH 2 (TWO) TIMES DAILY. Glori Luis, MD Taking Active               Assessment/Plan:   Diabetes: - Currently uncontrolled - Reviewed long term cardiovascular and renal outcomes of uncontrolled blood sugar - Reviewed goal A1c, goal fasting, and goal 2 hour post prandial glucose - Reviewed dietary modifications  including importance of having regular well-balanced meals and snacks while controlling carbohydrate portion sizes - Counseled on Ozempic, including mechanism of action, side effects, and benefits. No personal or family history of medullary thyroid cancer or personal history of pancreatitis. Note patient recalls history in her 87s, prior to becoming vegan had "gall bladder sludge", but no issues since. Denies history of gall stones or  other gall bladder disease.  Reviewed with patient available data related to Ozempic and gall bladder disease.  Let PCP know about patient's history of gall bladder sludge - Collaborate with PCP regarding diabetes medication management and monitoring for patient Let provider know patient interested in using continuous glucose monitoring (CGM). Note that Freestyle Libre CGM is covered by patient's Medicare prescription plan  Provider sends prescription for [employer] 3 sensors to pharmacy for patient Recommend provider consider switch from [facility] to [facility] for patient for medication affordability (patient assistance program availability)  Provider agrees - Recommend to start using Freestyle Libre 3 CGM to monitor blood sugar/as feedback on dietary choices Patient to pick up new test strips from pharmacy and check glucose with fingerstick check when needed for symptoms and as back up to CGM. Patient to contact office if needed for readings outside of established parameters or symptoms Provide patient with counseling on Freestyle Libre 3 CGM as well as link to guide/setup videos and contact number for My[employer]estyle Program via MyChart message - Meets financial criteria for [employer] patient assistance program through [employer]. Will collaborate with provider, CPhT, and patient to pursue assistance.      Follow Up Plan: Clinical Pharmacist will follow up with patient by telephone on 01/15/2023 at 1 pm  [name], PharmD, [facility] [phone]

## 2022-12-11 NOTE — Patient Instructions (Signed)
Goals Addressed             This Visit's Progress    Pharmacy Goals       Our goal A1c is less than 7%. This corresponds with fasting sugars less than 130 and 2 hour after meal sugars less than 180. Please keep a log of your results when checking your blood sugar   Please copy and paste the following web address is for the [employer] 3 Getting Started Setup Videos:   https://www.freestyle.abbott/us-en/how-to-set-up.[email or web address]   Also, you can reach out to the MyFreeStyle Support at [phone]. Their customer support is available 7 days a week 8 am to 8 pm.   Please watch the mail for an envelope from [employer] containing the patient assistance program application. Please complete this application and mail back to [facility] Technician Noreene Larsson [place] along with a copy of your Medicare Part D prescription card and a copy of your proof of income document OR you can bring these documents to office to have them faxed back to Attention: Pattricia Boss at Fax # [phone]   If you need to call Noreene Larsson, you can reach her at [phone]   Thank you!   [name], PharmD, [facility] [phone]

## 2022-12-14 ENCOUNTER — Telehealth: Payer: Self-pay | Admitting: Pharmacy Technician

## 2022-12-14 DIAGNOSIS — Z5986 Financial insecurity: Secondary | ICD-10-CM

## 2022-12-14 NOTE — Progress Notes (Signed)
[facility] Customer service manager [facility])                                            [name] Quality Pharmacy Team    12/14/2022  STARLIT KOENEN 1950-05-17 [id number]                                      Medication Assistance Referral  Referral From:  [facility] PharmD [name]  Medication/Company: Franki Monte / Novo Nordisk Patient application portion:  Mailed Provider application portion: Faxed  to Dr. Margarita Mail Provider address/fax verified via: Office website   Catherine Dodson, CPhT [place]  [facility] Office: [phone] Fax: [phone] Email: [email or web address]

## 2022-12-22 ENCOUNTER — Encounter: Payer: Self-pay | Admitting: Internal Medicine

## 2022-12-22 ENCOUNTER — Ambulatory Visit (INDEPENDENT_AMBULATORY_CARE_PROVIDER_SITE_OTHER): Payer: PPO | Admitting: Internal Medicine

## 2022-12-22 VITALS — BP 130/68 | HR 92 | Temp 98.3°F | Ht 66.0 in | Wt 168.0 lb

## 2022-12-22 DIAGNOSIS — R300 Dysuria: Secondary | ICD-10-CM | POA: Diagnosis not present

## 2022-12-22 DIAGNOSIS — N3090 Cystitis, unspecified without hematuria: Secondary | ICD-10-CM

## 2022-12-22 LAB — POCT URINALYSIS DIPSTICK
Bilirubin, UA: NEGATIVE
Blood, UA: NEGATIVE
Glucose, UA: NEGATIVE
Nitrite, UA: NEGATIVE
Protein, UA: NEGATIVE
Spec Grav, UA: <=1.005 — AB (ref 1.010–1.025)
Urobilinogen, UA: 0.2 E.U./dL
pH, UA: 5 (ref 5.0–8.0)

## 2022-12-22 NOTE — Progress Notes (Signed)
   Acute Office Visit  Subjective:     Patient ID: Catherine Dodson, female    DOB: 1950-12-07, 72 y.o.   MRN: [id number]  Chief Complaint  Patient presents with   Urinary Tract Infection    Started about 12/19/22, burning, frequent urination    Urinary Tract Infection  Associated symptoms include frequency and urgency. Pertinent negatives include no chills, flank pain or hematuria.   Patient is in today for dysuria. Symptoms started 2 days ago but have been improving.   URINARY SYMPTOMS Dysuria: yes Urinary frequency: yes Urgency: yes Small volume voids: yes Urinary incontinence: no Hematuria: no Abdominal pain: no Back pain: no Suprapubic pain/pressure: no Flank pain: no Fever:  no Relief with cranberry juice: yes Relief with pyridium: yes  Review of Systems  Constitutional:  Negative for chills and fever.  Gastrointestinal:  Negative for abdominal pain.  Genitourinary:  Positive for dysuria, frequency and urgency. Negative for flank pain and hematuria.        Objective:    BP 130/68   Pulse 92   Temp 98.3 F (36.8 C)   Ht 5\' 6"  (1.676 m)   Wt 168 lb (76.2 kg)   SpO2 98%   BMI 27.12 kg/m    Physical Exam Constitutional:      Appearance: Normal appearance.  HENT:     Head: Normocephalic and atraumatic.  Eyes:     Conjunctiva/sclera: Conjunctivae normal.  Cardiovascular:     Rate and Rhythm: Normal rate and regular rhythm.  Pulmonary:     Effort: Pulmonary effort is normal.     Breath sounds: Normal breath sounds.  Abdominal:     Tenderness: There is no right CVA tenderness or left CVA tenderness.  Skin:    General: Skin is warm and dry.  Neurological:     General: No focal deficit present.     Mental Status: She is alert. Mental status is at baseline.  Psychiatric:        Mood and Affect: Mood normal.        Behavior: Behavior normal.     No results found for any visits on 12/22/22.      Assessment & Plan:   1. Burning with  urination: UA in the office positive for leukocytes but negative for nitrates, will send for urine culture.  Patient symptoms are currently improving with Azo and cranberry.   - POCT urinalysis dipstick - Urine Culture   Return if symptoms worsen or fail to improve.  Margarita Mail, DO

## 2022-12-24 LAB — URINE CULTURE
MICRO NUMBER:: 15375062
SPECIMEN QUALITY:: ADEQUATE

## 2022-12-24 MED ORDER — SULFAMETHOXAZOLE-TRIMETHOPRIM 800-160 MG PO TABS: 1.0000 | ORAL_TABLET | Freq: Two times a day (BID) | ORAL | 0 refills | Status: AC

## 2022-12-24 NOTE — Addendum Note (Signed)
Addended by: Margarita Mail on: 12/24/2022 06:09 PM   Modules accepted: Orders

## 2022-12-25 ENCOUNTER — Encounter: Payer: Self-pay | Admitting: Internal Medicine

## 2022-12-27 ENCOUNTER — Telehealth: Payer: Self-pay | Admitting: Pharmacy Technician

## 2022-12-27 DIAGNOSIS — Z5986 Financial insecurity: Secondary | ICD-10-CM

## 2022-12-27 NOTE — Progress Notes (Signed)
[facility])                                            [facility] Quality Pharmacy Team    12/27/2022  Catherine Dodson 08/05/1950 [id number]  Received both patient and provider portion(s) of patient assistance application(s) for Ozempic. Faxed completed application and required documents into [employer].  Pattricia Boss, CPhT [place]  [facility] Office: [phone] Fax: [phone] Email: [email or web address]

## 2023-01-02 ENCOUNTER — Telehealth: Payer: Self-pay | Admitting: Pharmacy Technician

## 2023-01-02 DIAGNOSIS — Z5986 Financial insecurity: Secondary | ICD-10-CM

## 2023-01-02 NOTE — Progress Notes (Signed)
[facility] Customer service manager [facility])                                            [facility] Quality Pharmacy Team    01/02/2023  Catherine Dodson March 08, 1951 [id number]  Care coordination call placed to [employer] Nord[employer] in regard to Ozempic application.  Spoke to [place] who informs patient is APPROVED 01/02/2023-12/28/2023. Initial and subsequent shipments will be processed automatically and delivered to the prescriber's office. If patient at any time feels as though current supply is not sufficient until next supply arrives, patient may call [employer] at [phone].  Catherine Dodson, CPhT [place]  [facility] Office: [phone] Fax: [phone] Email: [name]

## 2023-01-15 ENCOUNTER — Encounter: Payer: Self-pay | Admitting: Pharmacist

## 2023-01-15 ENCOUNTER — Other Ambulatory Visit: Payer: PPO | Admitting: Pharmacist

## 2023-01-15 NOTE — Progress Notes (Signed)
01/15/2023 Name: Catherine Dodson MRN: [id number] DOB: March 01, 1951  Chief Complaint  Patient presents with   Medication Management    Catherine Dodson is a 72 y.o. year old female who presented for a telephone visit.   They were referred to the pharmacist by their PCP for assistance in managing diabetes and medication access.      Subjective:   Care Team: Primary Care Provider: Margarita Mail, DO ; Next Scheduled Visit: 02/20/2023  Medication Access/Adherence  Current Pharmacy:  [id number] [place], [place] - [address] [place] [address] Phone: [phone] Fax: [phone]   Patient reports affordability concerns with their medications: Yes  Patient reports access/transportation concerns to their pharmacy: No  Patient reports adherence concerns with their medications:  No     Today patient reports recently completed course of Septra DS as prescribed by PCP on 8/25 for urinary tract infection. Reports urinary symptoms resolved with completion of this antibiotic     Diabetes:   Current medications:  - glipizide 10 mg twice daily before meals - metformin ER 500 mg - 2 tablets (1000 mg) twice daily   Reports recent morning fasting readings ranging 115-126  Reports has not yet started using Freestyle Libre 3 continuous glucose monitor as first wanted to make sure that the sensors that she had picked up were not impacted by the recent recall from [employer].  - States that she has a Physicist, medical from her pharmacy with the impacted lot numbers and she will review this to determine if the sensors were impacted/need to be replaced    Patient denies hypoglycemic s/sx including dizziness, shakiness, sweating.    Reports she is working on making positive changes to improve her eating habits again    Current medication access support: patient enrolled in patient assistance for Ozempic from [employer] from 01/02/2023-12/28/2023  - Patient's  shipment of Ozempic is expected to arrive to office within the next 10-14 business days from approval   Objective:  Lab Results  Component Value Date   HGBA1C 10.1 (H) 11/16/2022    Lab Results  Component Value Date   CREATININE 0.66 11/16/2022   BUN 12 11/16/2022   NA 139 11/16/2022   K 5.0 11/16/2022   CL 102 11/16/2022   CO2 28 11/16/2022    Lab Results  Component Value Date   CHOL 205 (H) 11/16/2022   HDL 56 11/16/2022   LDLCALC 122 (H) 11/16/2022   TRIG 157 (H) 11/16/2022   CHOLHDL 3.7 11/16/2022    Medications Reviewed Today     Reviewed by Manuela Neptune, RPH-CPP (Pharmacist) on 01/15/23 at 2157  Med List Status: <None>   Medication Order Taking? Sig Documenting Provider Last Dose Status Informant  Accu-Chek Softclix Lancets lancets [id number]  Use as instructed Margarita Mail, DO  Active   Continuous Glucose Sensor (FREESTYLE LIBRE 3 SENSOR) [place] [id number]  1 each by Does not apply route every 14 (fourteen) days. Place 1 sensor on the skin every 14 days. Use to check glucose continuously Margarita Mail, DO  Active   glipiZIDE (GLUCOTROL) 10 MG tablet [id number] Yes Take 10 mg by mouth 2 (two) times daily. [provider] Taking Active   glucose blood test strip [id number]  Use as instructed to check blood sugar up to three times daily. E11.9 Margarita Mail, DO  Active   levothyroxine (SYNTHROID) 150 MCG tablet [id number]  Take 1 tablet (150 mcg total) by mouth daily. Caralee Ates,  Gentry Fitz, DO  Active   metFORMIN (GLUCOPHAGE-XR) 500 MG 24 hr tablet [id number] Yes TAKE 2 TABLETS (1,000 MG TOTAL) BY MOUTH 2 (TWO) TIMES DAILY. Glori Luis, MD Taking Active               Assessment/Plan:   Diabetes: - Currently uncontrolled based on latest A1C result - Have reviewed long term cardiovascular and renal outcomes of uncontrolled blood sugar - Reviewed goal A1c, goal fasting, and goal 2 hour post prandial glucose - Reviewed dietary  modifications including importance of having regular well-balanced meals and snacks while controlling carbohydrate portion sizes - Counseled on Ozempic, including mechanism of action, side effects, and benefits. No personal or family history of medullary thyroid cancer or personal history of pancreatitis. Note patient recalls history in her 70s, prior to becoming vegan had "gall bladder sludge", but no issues since. Denies history of gall stones or other gall bladder disease.  Have reviewed with patient and available data related to Ozempic and gall bladder disease.  - Send patient web address for "How to use" video for Ozempic. Patient verbalizes understanding to start with dose of Ozempic 0.25 mg weekly. Patient confirms will review video from manufacturer website and let clinical pharmacist or PCP know if she has any questions or concerns - Patient to monitor for signs/symptoms of hypoglycemia and to carry rapid-acting source of sugar with her to use in case needed for symptoms of hypoglycemia - Recommend to start using Freestyle Libre 3 CGM to monitor blood sugar/as feedback on dietary choices Patient to check glucose with fingerstick check when needed for symptoms and as back up to CGM. Patient to contact office if needed for readings outside of established parameters or symptoms Have provided patient with counseling on Freestyle Libre 3 CGM as well as link to guide/setup videos and contact number for My[employer] Program via MyChart message      Follow Up Plan: Clinical Pharmacist will follow up with patient by telephone on 04/04/2023 at 2:00 PM    [name], PharmD, [facility] [phone]

## 2023-01-15 NOTE — Patient Instructions (Signed)
Goals Addressed             This Visit's Progress    Pharmacy Goals       Plan is to start Ozempic 0.25 mg once weekly. Ozempic may cause stomach upset, queasiness, or constipation, especially when first starting. This generally improves over time. Call our office if these symptoms occur and worsen, or if you have severe symptoms such as vomiting, diarrhea, or stomach pain.   Please review the information/how to take video from the manufacturer website below and let me or the office know if you have any questions before starting Ozempic:  [email or web address]  Our goal A1c is less than 7%. This corresponds with fasting sugars less than 130 and 2 hour after meal sugars less than 180. Please keep a log of your results when checking your blood sugar   Please copy and paste the following web address is for the [employer] 3 Getting Started Setup Videos:   [email or web address].freestyle.abbott/us-en/how-to-set-up.html   Also, you can reach out to the My[employer]Style Support at [phone]. Their customer support is available 7 days a week 8 am to 8 pm.     Thank you!   [name], PharmD, [facility] [phone]

## 2023-01-18 ENCOUNTER — Ambulatory Visit: Payer: PPO

## 2023-01-22 ENCOUNTER — Encounter: Payer: Self-pay | Admitting: Internal Medicine

## 2023-02-15 ENCOUNTER — Other Ambulatory Visit: Payer: Self-pay | Admitting: Internal Medicine

## 2023-02-15 DIAGNOSIS — E039 Hypothyroidism, unspecified: Secondary | ICD-10-CM

## 2023-02-15 NOTE — Telephone Encounter (Signed)
Requested Prescriptions  Pending Prescriptions Disp Refills   levothyroxine (SYNTHROID) 150 MCG tablet [Pharmacy Med Name: LEVOTHYROXINE 150 MCG TABLET] 90 tablet 2    Sig: TAKE 1 TABLET BY MOUTH DAILY     Endocrinology:  Hypothyroid Agents Passed - 02/15/2023  6:22 AM      Passed - TSH in normal range and within 360 days    TSH  Date Value Ref Range Status  11/16/2022 0.70 0.40 - 4.50 mIU/L Final         Passed - Valid encounter within last 12 months    Recent Outpatient Visits           1 month ago Burning with urination   [facility] Margarita Mail, DO   2 months ago Type 2 diabetes mellitus with hyperglycemia, without long-term current use of insulin [facility])   [place] [facility] Margarita Mail, DO   3 months ago Type 2 diabetes mellitus without complication, unspecified whether long term insulin use [facility])   [place] [facility] Margarita Mail, DO       Future Appointments             In 5 days Margarita Mail, DO [facility], [facility]

## 2023-02-19 NOTE — Progress Notes (Unsigned)
Established Patient Office Visit  Subjective    Patient ID: Catherine Dodson, female    DOB: March 27, 1951  Age: 72 y.o. MRN: [id number]  CC:  No chief complaint on file.   HPI Catherine Dodson presents to recheck diabetes.   Diabetes, Type 2: -First diagnosed 25 years ago -Last A1c 10.1 7/24 -Medications: Glipizide 10 mg BID (only taking once a day), Metformin XR 1000 mg BID and Ozempic 0.25 mg (started about 4 weeks ago). Patient states she does have some nausea, constipation and decreased appetite with the Ozempic. Occasionally has diarrhea with the Metformin and the Glipizide caused some lower sugars -Had been given a samples of Jardiance, was too expensive, Glimepiride caused muscle aches. -Patient is compliant with the above medications -Checking BG at home: average in the morning - 80 -Diet: vegetarian  -Eye exam: UTD 3/24 -Foot exam: UTD 7/24 -Microalbumin: UTD 7/24 -Statin: No -PNA vaccine: Discuss at follow up -Denies symptoms of hypoglycemia, polyuria, polydipsia, foot ulcers/trauma. Does have occasional numbness extremities, foot ulcers/trauma.   HLD: -Medications: Nothing - has never been on anything for cholesterol but is hesitant  -Last lipid panel: Lipid Panel     Component Value Date/Time   CHOL 205 (H) 11/16/2022 1200   TRIG 157 (H) 11/16/2022 1200   HDL 56 11/16/2022 1200   CHOLHDL 3.7 11/16/2022 1200   VLDL 30.8 06/08/2016 1151   LDLCALC 122 (H) 11/16/2022 1200   The 10-year ASCVD risk score (Arnett DK, et al., 2019) is: 20%   Values used to calculate the score:     Age: 70 years     Sex: Female     Is Non-Hispanic African American: No     Diabetic: Yes     Tobacco smoker: No     Systolic Blood Pressure: 130 mmHg     Is BP treated: No     HDL Cholesterol: 56 mg/dL     Total Cholesterol: 205 mg/dL  Hypothyroidism: -Medications: NP thyroid 90 mg  -Patient is compliant with the above medication (s) at the above dose and reports no medication side  effects.  -Denies weight changes, cold./heat intolerance, skin changes, anxiety/palpitations  -Last TSH: 7/24 0.70  Iron deficiency/vitamin D deficiency/vitamin B12 deficiency: -Vegetarian diet -Is on a lot of supplements, does take vitamin B12 500 mcg twice a day, vitamin D 5000 IUs 4 times a week and time-released iron 25 mg daily -Labs 7/24 - hgb normal but ferritin low at 9 despite oral iron  Health Maintenance: -Blood work UTD -Mammogram 4/24 negative  -Cologuard 2/23 negative   Outpatient Encounter Medications as of 02/20/2023  Medication Sig   Accu-Chek Softclix Lancets lancets Use as instructed   Continuous Glucose Sensor (FREESTYLE LIBRE 3 SENSOR) MISC 1 each by Does not apply route every 14 (fourteen) days. Place 1 sensor on the skin every 14 days. Use to check glucose continuously   glipiZIDE (GLUCOTROL) 10 MG tablet Take 10 mg by mouth 2 (two) times daily.   glucose blood test strip Use as instructed to check blood sugar up to three times daily. E11.9   levothyroxine (SYNTHROID) 150 MCG tablet TAKE 1 TABLET BY MOUTH DAILY   metFORMIN (GLUCOPHAGE-XR) 500 MG 24 hr tablet TAKE 2 TABLETS (1,000 MG TOTAL) BY MOUTH 2 (TWO) TIMES DAILY.   No facility-administered encounter medications on file as of 02/20/2023.    Past Medical History:  Diagnosis Date   Allergy penicillin, novocaine, demerol, latex   Blood transfusion without reported diagnosis 1979  Cataract 2014   Had surgery 2015   Diabetes mellitus without complication (HCC) Type 2   Heart murmur 2018   Mild, had cardiac workup   History of MRSA infection    Thyroid disease     Past Surgical History:  Procedure Laterality Date   CATARACT EXTRACTION     2015   CESAREAN SECTION     CESAREAN SECTION  1986, 1992   DILATION AND CURETTAGE OF UTERUS     EYE SURGERY  1995   LIPOMA EXCISION  2011    Family History  Problem Relation Age of Onset   Hyperlipidemia Mother    Hypertension Mother    Heart disease  Mother    Lung cancer Father    Hypertension Sister    Arthritis Maternal Grandmother    Diabetes Maternal Grandmother    Miscarriages / Stillbirths Maternal Grandmother    Stroke Maternal Grandmother    Alcohol abuse Maternal Grandmother    Heart disease Maternal Grandfather    Breast cancer Neg Hx     Social History   Socioeconomic History   Marital status: Married    Spouse name: Not on file   Number of children: Not on file   Years of education: Not on file   Highest education level: Some college, no degree  Occupational History   Not on file  Tobacco Use   Smoking status: Former    Current packs/day: 0.00    Average packs/day: 0.3 packs/day for 2.0 years (0.5 ttl pk-yrs)    Types: Cigarettes    Quit date: 05/01/1970    Years since quitting: 52.8   Smokeless tobacco: Never   Tobacco comments:    1-2 years during college, light use  Vaping Use   Vaping status: Never Used  Substance and Sexual Activity   Alcohol use: No   Drug use: No   Sexual activity: Not Currently    Partners: Male  Other Topics Concern   Not on file  Social History Narrative   Not on file   Social Determinants of Health   Financial Resource Strain: Low Risk  (02/16/2023)   Overall Financial Resource Strain (CARDIA)    Difficulty of Paying Living Expenses: Not hard at all  Food Insecurity: No Food Insecurity (02/16/2023)   Hunger Vital Sign    Worried About Running Out of Food in the Last Year: Never true    Ran Out of Food in the Last Year: Never true  Transportation Needs: No Transportation Needs (02/16/2023)   PRAPARE - Administrator, Civil Service (Medical): No    Lack of Transportation (Non-Medical): No  Physical Activity: Insufficiently Active (02/16/2023)   Exercise Vital Sign    Days of Exercise per Week: 3 days    Minutes of Exercise per Session: 40 min  Stress: No Stress Concern Present (02/16/2023)   [employer] of Occupational Health - Occupational Stress  Questionnaire    Feeling of Stress : Not at all  Social Connections: Unknown (02/16/2023)   Social Connection and Isolation Panel [NHANES]    Frequency of Communication with Friends and Family: Once a week    Frequency of Social Gatherings with Friends and Family: Patient declined    Attends Religious Services: Patient declined    Database administrator or Organizations: No    Attends Engineer, structural: Not on file    Marital Status: Married  Catering manager Violence: Not on file    Review of Systems  Respiratory:  Negative for shortness of breath.   Cardiovascular:  Negative for chest pain.  Gastrointestinal:  Positive for constipation and nausea. Negative for abdominal pain, heartburn and vomiting.  All other systems reviewed and are negative.       Objective    BP 138/80   Pulse 88   Temp (!) 96.9 F (36.1 C)   Resp 18   Ht 5\' 6"  (1.676 m)   Wt 160 lb (72.6 kg)   SpO2 98%   BMI 25.82 kg/m   Physical Exam Constitutional:      Appearance: Normal appearance.  HENT:     Head: Normocephalic and atraumatic.  Eyes:     Conjunctiva/sclera: Conjunctivae normal.  Cardiovascular:     Rate and Rhythm: Normal rate and regular rhythm.  Pulmonary:     Effort: Pulmonary effort is normal.     Breath sounds: Normal breath sounds.  Skin:    General: Skin is warm and dry.  Neurological:     General: No focal deficit present.     Mental Status: She is alert. Mental status is at baseline.  Psychiatric:        Mood and Affect: Mood normal.        Behavior: Behavior normal.       Assessment & Plan:   1. Type 2 diabetes mellitus with hyperglycemia, without long-term current use of insulin (HCC): A1c improved to 7.5%. Will continue Ozempic 0.25 mg weekly, discontinue Glipizide and change Metformin to XR 750 mg BID. Will recheck A1c in 3 months.   - POCT HgB A1C - metFORMIN (GLUCOPHAGE-XR) 750 MG 24 hr tablet; Take 1 tablet (750 mg total) by mouth 2 (two) times  daily.  Dispense: 180 tablet; Refill: 0   Return in 3 months (on 05/23/2023).   Margarita Mail, DO

## 2023-02-20 ENCOUNTER — Encounter: Payer: Self-pay | Admitting: Internal Medicine

## 2023-02-20 ENCOUNTER — Ambulatory Visit (INDEPENDENT_AMBULATORY_CARE_PROVIDER_SITE_OTHER): Payer: PPO | Admitting: Internal Medicine

## 2023-02-20 VITALS — BP 138/80 | HR 88 | Temp 96.9°F | Resp 18 | Ht 66.0 in | Wt 160.0 lb

## 2023-02-20 DIAGNOSIS — Z7984 Long term (current) use of oral hypoglycemic drugs: Secondary | ICD-10-CM

## 2023-02-20 DIAGNOSIS — E1165 Type 2 diabetes mellitus with hyperglycemia: Secondary | ICD-10-CM | POA: Diagnosis not present

## 2023-02-20 LAB — POCT GLYCOSYLATED HEMOGLOBIN (HGB A1C): Hemoglobin A1C: 7.5 % — AB (ref 4.0–5.6)

## 2023-02-20 MED ORDER — METFORMIN HCL ER 750 MG PO TB24: 750.0000 mg | ORAL_TABLET | Freq: Two times a day (BID) | ORAL | 0 refills | Status: DC

## 2023-02-21 ENCOUNTER — Other Ambulatory Visit: Payer: Self-pay | Admitting: Internal Medicine

## 2023-02-21 DIAGNOSIS — E1165 Type 2 diabetes mellitus with hyperglycemia: Secondary | ICD-10-CM

## 2023-02-21 MED ORDER — FREESTYLE LIBRE 2 SENSOR MISC: 1.0000 | 2 refills | Status: DC

## 2023-02-21 MED ORDER — FREESTYLE LIBRE 2 READER DEVI: 1.0000 | 0 refills | Status: DC

## 2023-03-12 ENCOUNTER — Other Ambulatory Visit: Payer: Self-pay | Admitting: Internal Medicine

## 2023-03-12 ENCOUNTER — Encounter: Payer: Self-pay | Admitting: Internal Medicine

## 2023-03-12 DIAGNOSIS — E1165 Type 2 diabetes mellitus with hyperglycemia: Secondary | ICD-10-CM

## 2023-03-12 MED ORDER — OZEMPIC (0.25 OR 0.5 MG/DOSE) 2 MG/3ML ~~LOC~~ SOPN: 0.5000 mg | PEN_INJECTOR | SUBCUTANEOUS | 2 refills | Status: DC

## 2023-04-04 ENCOUNTER — Encounter: Payer: Self-pay | Admitting: Pharmacist

## 2023-04-04 ENCOUNTER — Other Ambulatory Visit: Payer: Self-pay | Admitting: Pharmacist

## 2023-04-04 NOTE — Patient Instructions (Signed)
Goals Addressed             This Visit's Progress    Pharmacy Goals       If you need to reach out to the patient assistance program regarding refills of Ozempic, you can do so by calling:   [employer] at [phone]  Our goal A1c is less than 7%. This corresponds with fasting sugars less than 130 and 2 hour after meal sugars less than 180. Please keep a log of your results when checking your blood sugar      Thank you!   [name], PharmD, [facility] [phone]

## 2023-04-04 NOTE — Progress Notes (Signed)
04/04/2023 Name: Catherine Dodson MRN: [id number] DOB: 14-Sep-1950  Chief Complaint  Patient presents with   Medication Assistance   Medication Management    Catherine Dodson is a 72 y.o. year old female who presented for a telephone visit.   They were referred to the pharmacist by their PCP for assistance in managing diabetes and medication access.    Subjective:  Care Team: Primary Care Provider: Margarita Mail, DO; Next Scheduled Visit: 05/24/2023   Medication Access/Adherence  Current Pharmacy:  [id number] - [place], [place] - [address] [place] [address] Phone: [phone] Fax: [phone]   Patient reports affordability concerns with their medications: No Patient reports access/transportation concerns to their pharmacy: No  Patient reports adherence concerns with their medications:  No       Diabetes:   Current medications:  - metformin ER 750 mg twice daily - Ozempic 0.5 mg weekly on Tuesdays (increased on 11/19)   Reports tolerating well; had some mild nausea, but    Reports recent morning fasting readings ranging 120-140; today: 129  Previous therapies tried: glipizide   Tried Freestyle Libre CGM for a time for feedback on dietary choices, but denies interest in using again for now   Patient denies hypoglycemic s/sx including dizziness, shakiness, sweating.    Reports continuing to work on making positive changes to improve her eating habits   Current physical activity: hiking 45-60 minutes x 3-4 times/week   Statin therapy: none  Current medication access support: patient enrolled in patient assistance for Ozempic from [employer] from 01/02/2023-12/28/2023    Objective:  Lab Results  Component Value Date   HGBA1C 7.5 (A) 02/20/2023    Lab Results  Component Value Date   CREATININE 0.66 11/16/2022   BUN 12 11/16/2022   NA 139 11/16/2022   K 5.0 11/16/2022   CL 102 11/16/2022   CO2 28  11/16/2022    Lab Results  Component Value Date   CHOL 205 (H) 11/16/2022   HDL 56 11/16/2022   LDLCALC 122 (H) 11/16/2022   TRIG 157 (H) 11/16/2022   CHOLHDL 3.7 11/16/2022    Medications Reviewed Today     Reviewed by Manuela Neptune, RPH-CPP (Pharmacist) on 04/04/23 at 1527  Med List Status: <None>   Medication Order Taking? Sig Documenting Provider Last Dose Status Informant  Accu-Chek Softclix Lancets lancets [id number]  Use as instructed Margarita Mail, DO  Active   Continuous Glucose Receiver (FREESTYLE LIBRE 2 READER) [place] [id number]  1 each by Does not apply route every 14 (fourteen) days. Margarita Mail, DO  Active   Continuous Glucose Sensor (FREESTYLE LIBRE 2 SENSOR) [place] [id number]  1 each by Does not apply route every 14 (fourteen) days. Margarita Mail, DO  Active   glucose blood test strip [id number]  Use as instructed to check blood sugar up to three times daily. E11.9 Margarita Mail, DO  Active   levothyroxine (SYNTHROID) 150 MCG tablet [id number]  TAKE 1 TABLET BY MOUTH DAILY Margarita Mail, DO  Active   metFORMIN (GLUCOPHAGE-XR) 750 MG 24 hr tablet [id number] Yes Take 1 tablet (750 mg total) by mouth 2 (two) times daily. Margarita Mail, DO Taking Active   Semaglutide,0.25 or 0.5MG /DOS, (OZEMPIC, 0.25 OR 0.5 MG/DOSE,) 2 MG/3ML SOPN [id number] Yes Inject 0.5 mg into the skin once a week. Margarita Mail, DO Taking Active              Assessment/Plan:   Diabetes: -  Improved based on reported home blood sugar readings - Have reviewed long term cardiovascular and renal outcomes of uncontrolled blood sugar - Reviewed goal A1c, goal fasting, and goal 2 hour post prandial glucose - Reviewed dietary modifications including importance of having regular well-balanced meals and snacks while controlling carbohydrate portion sizes -Patient to check glucose, keep log of results and have this record to review at upcoming medical appointments. Patient  to contact provider office sooner if needed for readings outside of established parameters or symptoms  - Patient to follow up with [employer] as needed for refills of Ozempic   Discussed with patient the importance of LDL control/ASCVD risk reduction.  Patient agrees to discuss statin therapy with PCP if needed following next lipid panel   Follow Up Plan: Clinical Pharmacist will follow up with patient by telephone on 11/07/2023 at 2:00 PM     [name], PharmD, [facility] [phone]

## 2023-05-16 ENCOUNTER — Other Ambulatory Visit: Payer: Self-pay | Admitting: Internal Medicine

## 2023-05-16 DIAGNOSIS — E1165 Type 2 diabetes mellitus with hyperglycemia: Secondary | ICD-10-CM

## 2023-05-17 NOTE — Telephone Encounter (Signed)
Requested Prescriptions  Pending Prescriptions Disp Refills   metFORMIN (GLUCOPHAGE-XR) 750 MG 24 hr tablet [Pharmacy Med Name: metFORMIN HCL XR 750 MG TABLET] 180 tablet 0    Sig: TAKE 1 TABLET BY MOUTH 2 TIMES A DAY     Endocrinology:  Diabetes - Biguanides Passed - 05/17/2023 12:18 PM      Passed - Cr in normal range and within 360 days    Creat  Date Value Ref Range Status  11/16/2022 0.66 0.60 - 1.00 mg/dL Final   Creatinine, Urine  Date Value Ref Range Status  11/16/2022 25 20 - 275 mg/dL Final         Passed - HBA1C is between 0 and 7.9 and within 180 days    Hemoglobin A1C  Date Value Ref Range Status  02/20/2023 7.5 (A) 4.0 - 5.6 % Final   Hgb A1c MFr Bld  Date Value Ref Range Status  11/16/2022 10.1 (H) <5.7 % of total Hgb Final    Comment:    For someone without known diabetes, a hemoglobin A1c value of 6.5% or greater indicates that they may have  diabetes and this should be confirmed with a follow-up  test. . For someone with known diabetes, a value <7% indicates  that their diabetes is well controlled and a value  greater than or equal to 7% indicates suboptimal  control. A1c targets should be individualized based on  duration of diabetes, age, comorbid conditions, and  other considerations. . Currently, no consensus exists regarding use of hemoglobin A1c for diagnosis of diabetes for children. .          Passed - eGFR in normal range and within 360 days    GFR calc Af Amer  Date Value Ref Range Status  12/27/2015 >60 >60 mL/min Final    Comment:    (NOTE) The eGFR has been calculated using the CKD EPI equation. This calculation has not been validated in all clinical situations. eGFR's persistently <60 mL/min signify possible Chronic Kidney Disease.    GFR calc non Af Amer  Date Value Ref Range Status  12/27/2015 >60 >60 mL/min Final   eGFR  Date Value Ref Range Status  11/16/2022 94 > OR = 60 mL/min/1.70m2 Final         Passed - B12 Level  in normal range and within 720 days    Vitamin B-12  Date Value Ref Range Status  11/16/2022 826 200 - 1,100 pg/mL Final         Passed - Valid encounter within last 6 months    Recent Outpatient Visits           2 months ago Type 2 diabetes mellitus with hyperglycemia, without long-term current use of insulin [facility])   [place] [facility] Margarita Mail, DO   4 months ago Burning with urination   [facility] Margarita Mail, DO   5 months ago Type 2 diabetes mellitus with hyperglycemia, without long-term current use of insulin [facility])   [place] [facility] Margarita Mail, DO   6 months ago Type 2 diabetes mellitus without complication, unspecified whether long term insulin use [facility])   [place] [facility] Margarita Mail, DO       Future Appointments             In 1 week Margarita Mail, DO [facility]            Passed -  CBC within normal limits and completed in the last 12 months    WBC  Date Value Ref Range Status  11/16/2022 5.7 3.8 - 10.8 Thousand/uL Final   RBC  Date Value Ref Range Status  11/16/2022 4.84 3.80 - 5.10 Million/uL Final   Hemoglobin  Date Value Ref Range Status  11/16/2022 13.1 11.7 - 15.5 g/dL Final   HCT  Date Value Ref Range Status  11/16/2022 40.3 35.0 - 45.0 % Final   MCHC  Date Value Ref Range Status  11/16/2022 32.5 32.0 - 36.0 g/dL Final   [facility]  Date Value Ref Range Status  11/16/2022 27.1 27.0 - 33.0 pg Final   MCV  Date Value Ref Range Status  11/16/2022 83.3 80.0 - 100.0 fL Final   No results found for: "PLTCOUNTKUC", "LABPLAT", "POCPLA" RDW  Date Value Ref Range Status  11/16/2022 13.6 11.0 - 15.0 % Final

## 2023-05-24 ENCOUNTER — Other Ambulatory Visit: Payer: Self-pay

## 2023-05-24 ENCOUNTER — Encounter: Payer: Self-pay | Admitting: Internal Medicine

## 2023-05-24 ENCOUNTER — Ambulatory Visit: Payer: PPO | Admitting: Internal Medicine

## 2023-05-24 VITALS — BP 118/72 | HR 98 | Temp 97.9°F | Resp 14 | Ht 66.0 in | Wt 145.3 lb

## 2023-05-24 DIAGNOSIS — E782 Mixed hyperlipidemia: Secondary | ICD-10-CM

## 2023-05-24 DIAGNOSIS — E1165 Type 2 diabetes mellitus with hyperglycemia: Secondary | ICD-10-CM | POA: Diagnosis not present

## 2023-05-24 DIAGNOSIS — E039 Hypothyroidism, unspecified: Secondary | ICD-10-CM | POA: Diagnosis not present

## 2023-05-24 DIAGNOSIS — R030 Elevated blood-pressure reading, without diagnosis of hypertension: Secondary | ICD-10-CM | POA: Diagnosis not present

## 2023-05-24 HISTORY — DX: Mixed hyperlipidemia: E78.2

## 2023-05-24 LAB — POCT GLYCOSYLATED HEMOGLOBIN (HGB A1C): Hemoglobin A1C: 6.7 % — AB (ref 4.0–5.6)

## 2023-05-24 NOTE — Assessment & Plan Note (Signed)
BP much better controlled with weight loss and healthy eating habits. Not on medication, continue to monitor.

## 2023-05-24 NOTE — Assessment & Plan Note (Signed)
LDL elevated, patient hesitant to take medication. Plan to recheck fasting labs at follow up now that patient has lost some weight and working on healthy diet.

## 2023-05-24 NOTE — Assessment & Plan Note (Signed)
Controlled with A1c down to 6.7. No changes made today, continue Ozempic 0.5 mg and Metformin.

## 2023-05-24 NOTE — Progress Notes (Signed)
Established Patient Office Visit  Subjective   Patient ID: Catherine Dodson, female    DOB: [birth year]  Age: 73 y.o. MRN: [id number]  Chief Complaint  Patient presents with   Medical Management of Chronic Issues    HPI  Catherine Dodson presents to recheck diabetes.   Diabetes, Type 2: -First diagnosed 25 years ago -Last A1c 7.5% 10/24 -Medications: Metformin decreased to XR 750 mg BID and Ozempic 0.5 mg, Patient states she does have some nausea, constipation and decreased appetite with the Ozempic the day of the injection but this resolves over the course of the week.  -Discontinued Glipizide 10 mg BID at LOV -Had been given a samples of Jardiance, was too expensive, Glimepiride caused muscle aches. -Patient is compliant with the above medications -Diet: vegetarian  -Eye exam: UTD 3/24 -Foot exam: UTD 7/24 -Microalbumin: UTD 7/24 -Statin: No -PNA vaccine: Discuss at follow up -Denies symptoms of hypoglycemia, polyuria, polydipsia, foot ulcers/trauma. Does have occasional numbness extremities, foot ulcers/trauma.   HLD: -Medications: Nothing - has never been on anything for cholesterol but is hesitant  -Last lipid panel: Lipid Panel     Component Value Date/Time   CHOL 205 (H) 11/16/2022 1200   TRIG 157 (H) 11/16/2022 1200   HDL 56 11/16/2022 1200   CHOLHDL 3.7 11/16/2022 1200   VLDL 30.8 06/08/2016 1151   LDLCALC 122 (H) 11/16/2022 1200   The 10-year ASCVD risk score (Arnett DK, et al., 2019) is: 16.9%   Values used to calculate the score:     Age: 41 years     Sex: Female     Is Non-Hispanic African American: No     Diabetic: Yes     Tobacco smoker: No     Systolic Blood Pressure: 118 mmHg     Is BP treated: No     HDL Cholesterol: 56 mg/dL     Total Cholesterol: 205 mg/dL   Hypothyroidism: -Medications: Levothyroxine 150 mcg   -Patient is compliant with the above medication (s) at the above dose and reports no medication side effects.  -Denies weight  changes, cold./heat intolerance, skin changes, anxiety/palpitations  -Last TSH: 7/24 0.70  Iron deficiency/vitamin D deficiency/vitamin B12 deficiency: -Vegetarian diet -Is on a lot of supplements, does take vitamin B12 500 mcg twice a day, vitamin D 5000 IUs 4 times a week and time-released iron 25 mg daily -Labs 7/24 - hgb normal but ferritin low at 9 despite oral iron  Health Maintenance: -Blood work UTD -Mammogram 4/24 negative  -Cologuard 2/23 negative   Patient Active Problem List   Diagnosis Date Noted   Mixed hyperlipidemia 05/24/2023   Low ferritin 11/29/2022   DM (diabetes mellitus), type 2 (HCC) 06/08/2016   Hypothyroidism 06/08/2016   Vegetarian 06/08/2016   Elevated BP without diagnosis of hypertension 06/08/2016   Past Medical History:  Diagnosis Date   Allergy penicillin, novocaine, demerol, latex   Blood transfusion without reported diagnosis 1979   Cataract 2014   Had surgery 2015   Diabetes mellitus without complication (HCC) Type 2   Heart murmur 2018   Mild, had cardiac workup   History of MRSA infection    Mixed hyperlipidemia 05/24/2023   Thyroid disease    Past Surgical History:  Procedure Laterality Date   CATARACT EXTRACTION     2015   CESAREAN SECTION     CESAREAN SECTION  1986, 1992   DILATION AND CURETTAGE OF UTERUS     EYE SURGERY  1995  LIPOMA EXCISION  2011   Social History   Tobacco Use   Smoking status: Former    Current packs/day: 0.00    Average packs/day: 0.3 packs/day for 2.0 years (0.5 ttl pk-yrs)    Types: Cigarettes    Quit date: 05/01/1970    Years since quitting: 53.0   Smokeless tobacco: Never   Tobacco comments:    1-2 years during college, light use  Vaping Use   Vaping status: Never Used  Substance Use Topics   Alcohol use: No   Drug use: No   Social History   Socioeconomic History   Marital status: Married    Spouse name: Not on file   Number of children: Not on file   Years of education: Not on file    Highest education level: Some college, no degree  Occupational History   Not on file  Tobacco Use   Smoking status: Former    Current packs/day: 0.00    Average packs/day: 0.3 packs/day for 2.0 years (0.5 ttl pk-yrs)    Types: Cigarettes    Quit date: 05/01/1970    Years since quitting: 53.0   Smokeless tobacco: Never   Tobacco comments:    1-2 years during college, light use  Vaping Use   Vaping status: Never Used  Substance and Sexual Activity   Alcohol use: No   Drug use: No   Sexual activity: Not Currently    Partners: Male  Other Topics Concern   Not on file  Social History Narrative   Not on file   Social Drivers of Health   Financial Resource Strain: Low Risk  (05/21/2023)   Overall Financial Resource Strain (CARDIA)    Difficulty of Paying Living Expenses: Not hard at all  Food Insecurity: No Food Insecurity (05/21/2023)   Hunger Vital Sign    Worried About Running Out of Food in the Last Year: Never true    Ran Out of Food in the Last Year: Never true  Transportation Needs: No Transportation Needs (05/21/2023)   PRAPARE - Administrator, Civil Service (Medical): No    Lack of Transportation (Non-Medical): No  Physical Activity: Insufficiently Active (05/21/2023)   Exercise Vital Sign    Days of Exercise per Week: 3 days    Minutes of Exercise per Session: 40 min  Stress: No Stress Concern Present (05/21/2023)   [employer] of Occupational Health - Occupational Stress Questionnaire    Feeling of Stress : Not at all  Social Connections: Unknown (05/21/2023)   Social Connection and Isolation Panel [NHANES]    Frequency of Communication with Friends and Family: Three times a week    Frequency of Social Gatherings with Friends and Family: Once a week    Attends Religious Services: Patient declined    Database administrator or Organizations: No    Attends Engineer, structural: Not on file    Marital Status: Married  Intimate Partner Violence:  Not on file   Family Status  Relation Name Status   Mother Rosalita Chessman Deceased   Father Greggory Stallion Deceased   Sister [place] Alive   MGM Myriam Jacobson (Not Specified)   MGF Myriam Jacobson (Not Specified)   Neg Hx  (Not Specified)  No partnership data on file   Family History  Problem Relation Age of Onset   Hyperlipidemia Mother    Hypertension Mother    Heart disease Mother    Lung cancer Father    Hypertension Sister    Arthritis Maternal  Grandmother    Diabetes Maternal Grandmother    Miscarriages / Stillbirths Maternal Grandmother    Stroke Maternal Grandmother    Alcohol abuse Maternal Grandmother    Heart disease Maternal Grandfather    Breast cancer Neg Hx    Allergies  Allergen Reactions   Ceclor [Cefaclor]    Codeine Nausea Only and Other (See Comments)    Caused extreme agitation & nausea   Meperidine Other (See Comments)    Caused extreme agitation   Morphine And Codeine    Penicillins    Xylocaine [Lidocaine Hcl]    Latex Rash   Nickel Rash      Review of Systems  All other systems reviewed and are negative.     Objective:     BP 118/72 (Cuff Size: Normal)   Pulse 98   Temp 97.9 F (36.6 C) (Oral)   Resp 14   Ht 5\' 6"  (1.676 m)   Wt 145 lb 4.8 oz (65.9 kg)   SpO2 99%   BMI 23.45 kg/m  BP Readings from Last 3 Encounters:  05/24/23 118/72  02/20/23 138/80  12/22/22 130/68   Wt Readings from Last 3 Encounters:  05/24/23 145 lb 4.8 oz (65.9 kg)  02/20/23 160 lb (72.6 kg)  12/22/22 168 lb (76.2 kg)      Physical Exam Constitutional:      Appearance: Normal appearance.  HENT:     Head: Normocephalic and atraumatic.     Mouth/Throat:     Mouth: Mucous membranes are moist.     Pharynx: Oropharynx is clear.  Eyes:     Extraocular Movements: Extraocular movements intact.     Conjunctiva/sclera: Conjunctivae normal.     Pupils: Pupils are equal, round, and reactive to light.  Cardiovascular:     Rate and Rhythm: Normal rate and regular rhythm.   Pulmonary:     Effort: Pulmonary effort is normal.     Breath sounds: Normal breath sounds.  Skin:    General: Skin is warm and dry.  Neurological:     General: No focal deficit present.     Mental Status: She is alert. Mental status is at baseline.  Psychiatric:        Mood and Affect: Mood normal.        Behavior: Behavior normal.      Results for orders placed or performed in visit on 05/24/23  POCT HgB A1C  Result Value Ref Range   Hemoglobin A1C 6.7 (A) 4.0 - 5.6 %   HbA1c POC (<> result, manual entry)     HbA1c, POC (prediabetic range)     HbA1c, POC (controlled diabetic range)      Last CBC Lab Results  Component Value Date   WBC 5.7 11/16/2022   HGB 13.1 11/16/2022   HCT 40.3 11/16/2022   MCV 83.3 11/16/2022   MCH 27.1 11/16/2022   RDW 13.6 11/16/2022   PLT 238 11/16/2022   Last metabolic panel Lab Results  Component Value Date   GLUCOSE 223 (H) 11/16/2022   NA 139 11/16/2022   K 5.0 11/16/2022   CL 102 11/16/2022   CO2 28 11/16/2022   BUN 12 11/16/2022   CREATININE 0.66 11/16/2022   EGFR 94 11/16/2022   CALCIUM 10.2 11/16/2022   PROT 6.9 11/16/2022   ALBUMIN 4.5 12/27/2015   BILITOT 0.5 11/16/2022   ALKPHOS 66 12/27/2015   AST 17 11/16/2022   ALT 18 11/16/2022   ANIONGAP 8 12/27/2015   Last lipids Lab  Results  Component Value Date   CHOL 205 (H) 11/16/2022   HDL 56 11/16/2022   LDLCALC 122 (H) 11/16/2022   TRIG 157 (H) 11/16/2022   CHOLHDL 3.7 11/16/2022   Last hemoglobin A1c Lab Results  Component Value Date   HGBA1C 6.7 (A) 05/24/2023   Last thyroid functions Lab Results  Component Value Date   TSH 0.70 11/16/2022   Last vitamin D Lab Results  Component Value Date   VD25OH 64 11/16/2022   Last vitamin B12 and Folate Lab Results  Component Value Date   VITAMINB12 826 11/16/2022   FOLATE 20.9 11/16/2022      The 10-year ASCVD risk score (Arnett DK, et al., 2019) is: 16.9%    Assessment & Plan:  Type 2 diabetes  mellitus with hyperglycemia, without long-term current use of insulin (HCC) Assessment & Plan: Controlled with A1c down to 6.7. No changes made today, continue Ozempic 0.5 mg and Metformin.   Orders: -     POCT glycosylated hemoglobin (Hb A1C)  Elevated BP without diagnosis of hypertension Assessment & Plan: BP much better controlled with weight loss and healthy eating habits. Not on medication, continue to monitor.    Hypothyroidism, unspecified type Assessment & Plan: Stable, continue current dose of Levothyroxine. Plan to recheck labs at follow up.   Mixed hyperlipidemia Assessment & Plan: LDL elevated, patient hesitant to take medication. Plan to recheck fasting labs at follow up now that patient has lost some weight and working on healthy diet.       Return in about 6 months (around 11/21/2023).    Margarita Mail, DO

## 2023-05-24 NOTE — Assessment & Plan Note (Signed)
Stable, continue current dose of Levothyroxine. Plan to recheck labs at follow up.

## 2023-06-20 ENCOUNTER — Telehealth: Payer: Self-pay

## 2023-06-20 NOTE — Telephone Encounter (Signed)
 Copied from CRM [id number]. Topic: General - Other >> Jun 20, 2023  1:28 PM Antony Haste wrote: Reason for CRM: Cassandra from [employer] on behalf of the patient for Ozempic order from 04/2023, she states the initial order from 04/16/2023 has been returned back to them and she is wanting to confirm if the patient has received this or not. Elonda Husky is requesting a follow-up call regarding this. She states if the patient has not received the previous order of Ozempic from [place] she will have to [employer] to obtain a voucher.  Callback #: [phone]

## 2023-06-20 NOTE — Telephone Encounter (Signed)
 Called patient left vm asking if she had received her ozempic if not to let us know so we could call [facility] back to get her a voucher

## 2023-08-12 ENCOUNTER — Other Ambulatory Visit: Payer: Self-pay | Admitting: Internal Medicine

## 2023-08-12 DIAGNOSIS — E1165 Type 2 diabetes mellitus with hyperglycemia: Secondary | ICD-10-CM

## 2023-08-13 NOTE — Telephone Encounter (Signed)
 Requested Prescriptions  Pending Prescriptions Disp Refills   metFORMIN (GLUCOPHAGE-XR) 750 MG 24 hr tablet [Pharmacy Med Name: metFORMIN HCL XR 750 MG TABLET] 180 tablet 0    Sig: TAKE 1 TABLET BY MOUTH 2 TIMES A DAY     Endocrinology:  Diabetes - Biguanides Failed - 08/13/2023  5:16 PM      Failed - Valid encounter within last 6 months    Recent Outpatient Visits   None     Future Appointments             In 3 months Catherine Cid, DO [facility]            Passed - Cr in normal range and within 360 days    Creat  Date Value Ref Range Status  11/16/2022 0.66 0.60 - 1.00 mg/dL Final   Creatinine, Urine  Date Value Ref Range Status  11/16/2022 25 20 - 275 mg/dL Final         Passed - HBA1C is between 0 and 7.9 and within 180 days    Hemoglobin A1C  Date Value Ref Range Status  05/24/2023 6.7 (A) 4.0 - 5.6 % Final   Hgb A1c MFr Bld  Date Value Ref Range Status  11/16/2022 10.1 (H) <5.7 % of total Hgb Final    Comment:    For someone without known diabetes, a hemoglobin A1c value of 6.5% or greater indicates that they may have  diabetes and this should be confirmed with a follow-up  test. . For someone with known diabetes, a value <7% indicates  that their diabetes is well controlled and a value  greater than or equal to 7% indicates suboptimal  control. A1c targets should be individualized based on  duration of diabetes, age, comorbid conditions, and  other considerations. . Currently, no consensus exists regarding use of hemoglobin A1c for diagnosis of diabetes for children. .          Passed - eGFR in normal range and within 360 days    GFR calc Af Amer  Date Value Ref Range Status  12/27/2015 >60 >60 mL/min Final    Comment:    (NOTE) The eGFR has been calculated using the CKD EPI equation. This calculation has not been validated in all clinical situations. eGFR's persistently <60 mL/min signify possible Chronic  Kidney Disease.    GFR calc non Af Amer  Date Value Ref Range Status  12/27/2015 >60 >60 mL/min Final   eGFR  Date Value Ref Range Status  11/16/2022 94 > OR = 60 mL/min/1.26m2 Final         Passed - B12 Level in normal range and within 720 days    Vitamin B-12  Date Value Ref Range Status  11/16/2022 826 200 - 1,100 pg/mL Final         Passed - CBC within normal limits and completed in the last 12 months    WBC  Date Value Ref Range Status  11/16/2022 5.7 3.8 - 10.8 Thousand/uL Final   RBC  Date Value Ref Range Status  11/16/2022 4.84 3.80 - 5.10 Million/uL Final   Hemoglobin  Date Value Ref Range Status  11/16/2022 13.1 11.7 - 15.5 g/dL Final   HCT  Date Value Ref Range Status  11/16/2022 40.3 35.0 - 45.0 % Final   MCHC  Date Value Ref Range Status  11/16/2022 32.5 32.0 - 36.0 g/dL Final   [facility]  Date Value Ref Range Status  11/16/2022  27.1 27.0 - 33.0 pg Final   MCV  Date Value Ref Range Status  11/16/2022 83.3 80.0 - 100.0 fL Final   No results found for: "PLTCOUNTKUC", "LABPLAT", "POCPLA" RDW  Date Value Ref Range Status  11/16/2022 13.6 11.0 - 15.0 % Final

## 2023-10-27 ENCOUNTER — Encounter: Payer: Self-pay | Admitting: Internal Medicine

## 2023-10-29 ENCOUNTER — Telehealth: Payer: Self-pay

## 2023-10-29 ENCOUNTER — Other Ambulatory Visit: Payer: Self-pay | Admitting: Internal Medicine

## 2023-10-29 DIAGNOSIS — E1165 Type 2 diabetes mellitus with hyperglycemia: Secondary | ICD-10-CM

## 2023-10-29 MED ORDER — OZEMPIC (0.25 OR 0.5 MG/DOSE) 2 MG/3ML ~~LOC~~ SOPN: 0.5000 mg | PEN_INJECTOR | SUBCUTANEOUS | 0 refills | Status: AC

## 2023-10-29 NOTE — Telephone Encounter (Signed)
 Paperwork being sent for patient assistance

## 2023-10-29 NOTE — Telephone Encounter (Signed)
 I have already contacted [facility] they are sending us  refill paperwork.  Thanks

## 2023-10-29 NOTE — Telephone Encounter (Signed)
 What do we need to do?

## 2023-10-29 NOTE — Progress Notes (Signed)
   10/29/2023  Patient ID: Catherine Dodson, female   DOB: 1951-01-10, 73 y.o.   MRN: [id number]  In basket message from PCP office stating prescription refill for Ozempic  0.5mg  weekly recently sent to Catherine Dodson, but patient receives this through [facility] P[facility].  Coordinating with medication assistance team to check on next refill of Ozempic , as message to prescriber indicates patient only has a couple of doses remaining.  Catherine Dodson, PharmD, DPLA

## 2023-10-29 NOTE — Telephone Encounter (Signed)
 Copied from CRM [id number]. Topic: Clinical - Prescription Issue >> Oct 29, 2023  3:24 PM Charlet HERO wrote: Reason for CRM: Patient is calling bc script was sent to pharmacy when it was suppose to be sent as part of free patient asst program and sent to office Semaglutide ,0.25 or 0.5MG /DOS, (OZEMPIC , 0.25 OR 0.5 MG/DOSE,) 2 MG/3ML SOPN she would like to have it done where it is free and sent to office. She states that [place] the pharmacy rep is the one who helped her get it.

## 2023-10-30 ENCOUNTER — Telehealth: Payer: Self-pay

## 2023-10-30 NOTE — Telephone Encounter (Signed)
 Gave [employer] a call following up [facility] request to follow up on pt's refill not receiving it,per [employer] representative said last refill pt received was in December/2024 they received pt pap but on provider portion  was not specify which Ozempic  and the quantity if pt needs a refill we need to send a refill  reorder request form and pt needs to re apply by July 29,2025,Faxing refill request to provider today and will start an new application process.

## 2023-11-01 NOTE — Telephone Encounter (Signed)
 Received provider portion [employer] Ozempic (PAP) faxed to company ,will follow up in a couple of days.

## 2023-11-06 NOTE — Telephone Encounter (Signed)
 Gave [employer] a call to follow up on pt Refill request on Ozempic , per representative they have mail out a refill to provider office on 11/05/23 it will take 10-14 days, pt needs to re-apply at the end of July application will expired on 8/29.

## 2023-11-07 ENCOUNTER — Other Ambulatory Visit: Payer: PPO | Admitting: Pharmacist

## 2023-11-07 DIAGNOSIS — E1165 Type 2 diabetes mellitus with hyperglycemia: Secondary | ICD-10-CM

## 2023-11-07 NOTE — Progress Notes (Signed)
 11/07/2023 Name: Catherine Dodson MRN: [id number] DOB: [birth year]  Chief Complaint  Patient presents with   Medication Assistance    Catherine Dodson is a 73 y.o. year old female who presented for a telephone visit.   They were referred to the pharmacist by their PCP for assistance in managing diabetes and medication access.      Subjective:   Care Team: Primary Care Provider: Bernardo Fend, DO; Next Scheduled Visit: 11/22/2023     Medication Access/Adherence  Current Pharmacy:  [facility] [id number] - [place], [place] - [address] [place] [address] Phone: [phone] Fax: [phone]   Patient reports affordability concerns with their medications: No Patient reports access/transportation concerns to their pharmacy: No  Patient reports adherence concerns with their medications:  No       Diabetes:   Current medications:  - metformin  ER 750 mg twice daily - Ozempic  0.5 mg weekly on Tuesdays             Reports tolerating well   Reports recent morning fasting readings ranging 108-110s; today ~1 hour after meal: 160   Previous therapies tried: glipizide    Patient denies hypoglycemic s/sx including dizziness, shakiness, sweating.    Reports recently noticed higher blood sugar readings due to eating more watermelon, but no longer eating the watermelon   Current physical activity: recently limited by heat, but will try to start walking in gym or outdoors during cooler parts of the day   Statin therapy: none   Current medication access support: patient enrolled in patient assistance for Ozempic  from [employer] from 01/02/2023-12/28/2023      Objective:  Lab Results  Component Value Date   HGBA1C 6.7 (A) 05/24/2023    Lab Results  Component Value Date   CREATININE 0.66 11/16/2022   BUN 12 11/16/2022   NA 139 11/16/2022   K 5.0 11/16/2022   CL 102 11/16/2022   CO2 28 11/16/2022    Lab Results  Component Value Date    CHOL 205 (H) 11/16/2022   HDL 56 11/16/2022   LDLCALC 122 (H) 11/16/2022   TRIG 157 (H) 11/16/2022   CHOLHDL 3.7 11/16/2022    Current Outpatient Medications on File Prior to Visit  Medication Sig Dispense Refill   metFORMIN  (GLUCOPHAGE -XR) 750 MG 24 hr tablet TAKE 1 TABLET BY MOUTH 2 TIMES A DAY 180 tablet 0   Semaglutide ,0.25 or 0.5MG /DOS, (OZEMPIC , 0.25 OR 0.5 MG/DOSE,) 2 MG/3ML SOPN Inject 0.5 mg into the skin once a week. 3 mL 0   Accu-Chek Softclix Lancets lancets Use as instructed (Patient not taking: Reported on 05/24/2023) 100 each 12   glucose blood test strip Use as instructed to check blood sugar up to three times daily. E11.9 (Patient not taking: Reported on 05/24/2023) 100 each 12   levothyroxine  (SYNTHROID ) 150 MCG tablet TAKE 1 TABLET BY MOUTH DAILY 90 tablet 2   No current facility-administered medications on file prior to visit.       Assessment/Plan:   Diabetes: - Controlled - Have reviewed long term cardiovascular and renal outcomes of uncontrolled blood sugar - Reviewed goal A1c, goal fasting, and goal 2 hour post prandial glucose - Reviewed dietary modifications including importance of having regular well-balanced meals and snacks while controlling carbohydrate portion sizes -Patient to check glucose, keep log of results and have this record to review at upcoming medical appointments. Patient to contact provider office sooner if needed for readings outside of established  parameters or symptoms  - Patient to follow up with CPhT Ana Ollison-Moran as needed to initiate refills of Ozempic  - Will collaborate with PCP and CPhT to apply for re-enrollment in Ozempic  patient assistance program   Note re-enrollment application will need to be initiated on/after 11/28/2023   Discussed with patient the importance of LDL control/ASCVD risk reduction.  Patient agrees to discuss statin therapy with PCP if needed following next lipid panel    Follow Up Plan: Clinical  Pharmacist will follow up with patient by telephone on 12/26/2023 at 2:00 PM    Sharyle Sia, PharmD, [facility] [phone]

## 2023-11-07 NOTE — Patient Instructions (Signed)
 Goals Addressed             This Visit's Progress    Pharmacy Goals       If you need to reach out to the patient assistance program regarding refills of Ozempic, you can do so by calling:   [employer] at [phone]  Our goal A1c is less than 7%. This corresponds with fasting sugars less than 130 and 2 hour after meal sugars less than 180. Please keep a log of your results when checking your blood sugar      Thank you!   [name], PharmD, [facility] [phone]

## 2023-11-09 ENCOUNTER — Other Ambulatory Visit: Payer: Self-pay | Admitting: Internal Medicine

## 2023-11-09 DIAGNOSIS — E1165 Type 2 diabetes mellitus with hyperglycemia: Secondary | ICD-10-CM

## 2023-11-12 NOTE — Telephone Encounter (Signed)
 Requested Prescriptions  Pending Prescriptions Disp Refills   metFORMIN  (GLUCOPHAGE -XR) 750 MG 24 hr tablet [Pharmacy Med Name: metFORMIN  HCL XR 750 MG TABLET] 180 tablet 0    Sig: TAKE 1 TABLET BY MOUTH 2 TIMES A DAY     Endocrinology:  Diabetes - Biguanides Failed - 11/12/2023  7:34 AM      Failed - Cr in normal range and within 360 days    Creat  Date Value Ref Range Status  11/16/2022 0.66 0.60 - 1.00 mg/dL Final   Creatinine, Urine  Date Value Ref Range Status  11/16/2022 25 20 - 275 mg/dL Final         Failed - eGFR in normal range and within 360 days    GFR calc Af Amer  Date Value Ref Range Status  12/27/2015 >60 >60 mL/min Final    Comment:    (NOTE) The eGFR has been calculated using the CKD EPI equation. This calculation has not been validated in all clinical situations. eGFR's persistently <60 mL/min signify possible Chronic Kidney Disease.    GFR calc non Af Amer  Date Value Ref Range Status  12/27/2015 >60 >60 mL/min Final   eGFR  Date Value Ref Range Status  11/16/2022 94 > OR = 60 mL/min/1.79m2 Final         Failed - Valid encounter within last 6 months    Recent Outpatient Visits   None     Future Appointments             In 1 week Bernardo Fend, DO [facility]            Failed - CBC within normal limits and completed in the last 12 months    WBC  Date Value Ref Range Status  11/16/2022 5.7 3.8 - 10.8 Thousand/uL Final   RBC  Date Value Ref Range Status  11/16/2022 4.84 3.80 - 5.10 Million/uL Final   Hemoglobin  Date Value Ref Range Status  11/16/2022 13.1 11.7 - 15.5 g/dL Final   HCT  Date Value Ref Range Status  11/16/2022 40.3 35.0 - 45.0 % Final   MCHC  Date Value Ref Range Status  11/16/2022 32.5 32.0 - 36.0 g/dL Final   [facility]  Date Value Ref Range Status  11/16/2022 27.1 27.0 - 33.0 pg Final   MCV  Date Value Ref Range Status  11/16/2022 83.3 80.0 - 100.0 fL Final   No results  found for: PLTCOUNTKUC, LABPLAT, POCPLA RDW  Date Value Ref Range Status  11/16/2022 13.6 11.0 - 15.0 % Final         Passed - HBA1C is between 0 and 7.9 and within 180 days    Hemoglobin A1C  Date Value Ref Range Status  05/24/2023 6.7 (A) 4.0 - 5.6 % Final   Hgb A1c MFr Bld  Date Value Ref Range Status  11/16/2022 10.1 (H) <5.7 % of total Hgb Final    Comment:    For someone without known diabetes, a hemoglobin A1c value of 6.5% or greater indicates that they may have  diabetes and this should be confirmed with a follow-up  test. . For someone with known diabetes, a value <7% indicates  that their diabetes is well controlled and a value  greater than or equal to 7% indicates suboptimal  control. A1c targets should be individualized based on  duration of diabetes, age, comorbid conditions, and  other considerations. . Currently, no consensus exists regarding use of hemoglobin  A1c for diagnosis of diabetes for children. .          Passed - B12 Level in normal range and within 720 days    Vitamin B-12  Date Value Ref Range Status  11/16/2022 826 200 - 1,100 pg/mL Final

## 2023-11-22 ENCOUNTER — Telehealth: Payer: Self-pay | Admitting: Internal Medicine

## 2023-11-22 ENCOUNTER — Encounter: Payer: Self-pay | Admitting: Internal Medicine

## 2023-11-22 ENCOUNTER — Other Ambulatory Visit: Payer: Self-pay

## 2023-11-22 ENCOUNTER — Ambulatory Visit: Payer: Self-pay | Admitting: Internal Medicine

## 2023-11-22 VITALS — BP 120/72 | HR 90 | Temp 97.7°F | Resp 16 | Ht 66.0 in | Wt 132.4 lb

## 2023-11-22 DIAGNOSIS — Z7985 Long-term (current) use of injectable non-insulin antidiabetic drugs: Secondary | ICD-10-CM | POA: Diagnosis not present

## 2023-11-22 DIAGNOSIS — E782 Mixed hyperlipidemia: Secondary | ICD-10-CM

## 2023-11-22 DIAGNOSIS — Z7984 Long term (current) use of oral hypoglycemic drugs: Secondary | ICD-10-CM

## 2023-11-22 DIAGNOSIS — R790 Abnormal level of blood mineral: Secondary | ICD-10-CM | POA: Diagnosis not present

## 2023-11-22 DIAGNOSIS — E039 Hypothyroidism, unspecified: Secondary | ICD-10-CM | POA: Diagnosis not present

## 2023-11-22 DIAGNOSIS — E1165 Type 2 diabetes mellitus with hyperglycemia: Secondary | ICD-10-CM | POA: Diagnosis not present

## 2023-11-22 DIAGNOSIS — M65321 Trigger finger, right index finger: Secondary | ICD-10-CM

## 2023-11-22 NOTE — Telephone Encounter (Signed)
 Order sent to get records

## 2023-11-22 NOTE — Progress Notes (Signed)
 Established Patient Office Visit  Subjective   Patient ID: Catherine Dodson, female    DOB: Jan 25, 1951  Age: 73 y.o. MRN: [id number]  Chief Complaint  Patient presents with   Medical Management of Chronic Issues    HPI  Catherine Dodson presents for follow up on chronic medical conditions.   Discussed the use of AI scribe software for clinical note transcription with the patient, who gave verbal consent to proceed.  History of Present Illness Catherine Dodson is a 73 year old female who presents with a locking index finger.  She experiences a locking sensation in her right index finger, known as trigger finger. The finger locks when fully closed, requiring manual assistance to unlock. This condition affects her ability to sew, an activity she previously enjoyed.  Her diabetes is managed with Ozempic  0.5 mg and metformin , with her last A1c at 6.7% in January. She is cautious about sugar intake, noting a recent increase in watermelon consumption and a small scoop of regular ice cream. She has joined a gym to maintain physical activity.  Her medication regimen includes thyroid  medication, vitamin D , and B12 supplements. She uses a pill case for weekly organization. She has a history of a mild heart murmur, previously evaluated as normal, with no recent changes. She experiences occasional tingling in her feet, and her husband assists with foot care by applying cream nightly.   Diabetes, Type 2: -First diagnosed 25 years ago -Last A1c 6.7% 1/25 -Medications: Metformin  decreased to XR 750 mg BID and Ozempic  0.5 mg, Patient states she does have some nausea, constipation and decreased appetite with the Ozempic  the day of the injection but this resolves over the course of the week.  -Discontinued Glipizide 10 mg BID at LOV -Had been given a samples of Jardiance , was too expensive, Glimepiride caused muscle aches. -Patient is compliant with the above medications -Diet: vegetarian  -Eye exam:  Due, has an ophthalmologist she follows with -Foot exam: Due -Microalbumin: Due -Statin: No -PNA vaccine: Discuss at follow up -Denies symptoms of hypoglycemia, polyuria, polydipsia, foot ulcers/trauma. Does have occasional numbness extremities, foot ulcers/trauma.   HLD: -Medications: Nothing - has never been on anything for cholesterol but is hesitant  -Last lipid panel: Lipid Panel     Component Value Date/Time   CHOL 205 (H) 11/16/2022 1200   TRIG 157 (H) 11/16/2022 1200   HDL 56 11/16/2022 1200   CHOLHDL 3.7 11/16/2022 1200   VLDL 30.8 06/08/2016 1151   LDLCALC 122 (H) 11/16/2022 1200   The 10-year ASCVD risk score (Arnett DK, et al., 2019) is: 19.2%   Values used to calculate the score:     Age: 5 years     Clincally relevant sex: Female     Is Non-Hispanic African American: No     Diabetic: Yes     Tobacco smoker: No     Systolic Blood Pressure: 120 mmHg     Is BP treated: No     HDL Cholesterol: 56 mg/dL     Total Cholesterol: 205 mg/dL   Hypothyroidism: -Medications: Levothyroxine  150 mcg   -Patient is compliant with the above medication (s) at the above dose and reports no medication side effects.  -Denies weight changes, cold./heat intolerance, skin changes, anxiety/palpitations  -Last TSH: 7/24 0.70  Iron deficiency/vitamin D  deficiency/vitamin B12 deficiency: -Vegetarian diet -Is on a lot of supplements, does take vitamin B12 500 mcg twice a day, vitamin D  5000 IUs 3 times a week and time-released  iron 25 mg daily -Labs 7/24 - hgb normal but ferritin low at 9 despite oral iron  Health Maintenance: -Blood work due -Mammogram 4/24 negative; plan for next spring -Cologuard 2/23 negative   Patient Active Problem List   Diagnosis Date Noted   Mixed hyperlipidemia 05/24/2023   Low ferritin 11/29/2022   DM (diabetes mellitus), type 2 (HCC) 06/08/2016   Hypothyroidism 06/08/2016   Vegetarian 06/08/2016   Elevated BP without diagnosis of hypertension  06/08/2016   Past Medical History:  Diagnosis Date   Allergy penicillin, novocaine, demerol, latex   Blood transfusion without reported diagnosis 1979   Cataract 2014   Had surgery 2015   Diabetes mellitus without complication (HCC) Type 2   Heart murmur 2018   Mild, had cardiac workup   History of MRSA infection    Mixed hyperlipidemia 05/24/2023   Thyroid  disease    Past Surgical History:  Procedure Laterality Date   CATARACT EXTRACTION     2015   CESAREAN SECTION     CESAREAN SECTION  1986, 1992   DILATION AND CURETTAGE OF UTERUS     EYE SURGERY  1995   LIPOMA EXCISION  2011   Social History   Tobacco Use   Smoking status: Former    Current packs/day: 0.00    Average packs/day: 0.3 packs/day for 2.0 years (0.5 ttl pk-yrs)    Types: Cigarettes    Quit date: 05/01/1970    Years since quitting: 53.5   Smokeless tobacco: Never   Tobacco comments:    1-2 years during college, light use  Vaping Use   Vaping status: Never Used  Substance Use Topics   Alcohol use: No   Drug use: No   Social History   Socioeconomic History   Marital status: Married    Spouse name: Not on file   Number of children: Not on file   Years of education: Not on file   Highest education level: Some college, no degree  Occupational History   Not on file  Tobacco Use   Smoking status: Former    Current packs/day: 0.00    Average packs/day: 0.3 packs/day for 2.0 years (0.5 ttl pk-yrs)    Types: Cigarettes    Quit date: 05/01/1970    Years since quitting: 53.5   Smokeless tobacco: Never   Tobacco comments:    1-2 years during college, light use  Vaping Use   Vaping status: Never Used  Substance and Sexual Activity   Alcohol use: No   Drug use: No   Sexual activity: Not Currently    Partners: Male  Other Topics Concern   Not on file  Social History Narrative   Not on file   Social Drivers of Health   Financial Resource Strain: Low Risk  (11/18/2023)   Overall Financial Resource  Strain (CARDIA)    Difficulty of Paying Living Expenses: Not hard at all  Food Insecurity: No Food Insecurity (11/18/2023)   Hunger Vital Sign    Worried About Running Out of Food in the Last Year: Never true    Ran Out of Food in the Last Year: Never true  Transportation Needs: No Transportation Needs (11/18/2023)   PRAPARE - Administrator, Civil Service (Medical): No    Lack of Transportation (Non-Medical): No  Physical Activity: Insufficiently Active (11/18/2023)   Exercise Vital Sign    Days of Exercise per Week: 3 days    Minutes of Exercise per Session: 40 min  Stress: No  Stress Concern Present (11/18/2023)   [employer] of Occupational Health - Occupational Stress Questionnaire    Feeling of Stress: Not at all  Social Connections: Moderately Isolated (11/18/2023)   Social Connection and Isolation Panel    Frequency of Communication with Friends and Family: Twice a week    Frequency of Social Gatherings with Friends and Family: Once a week    Attends Religious Services: Patient declined    Database administrator or Organizations: No    Attends Engineer, structural: Not on file    Marital Status: Married  Catering manager Violence: Not on file   Family Status  Relation Name Status   Mother Elvie Deceased   Father Zachary Deceased   Sister Virginia  Alive   MGM IT consultant (Not Specified)   MGF IT consultant (Not Specified)   Neg Hx  (Not Specified)  No partnership data on file   Family History  Problem Relation Age of Onset   Hyperlipidemia Mother    Hypertension Mother    Heart disease Mother    Lung cancer Father    Hypertension Sister    Arthritis Maternal Grandmother    Diabetes Maternal Grandmother    Miscarriages / Stillbirths Maternal Grandmother    Stroke Maternal Grandmother    Alcohol abuse Maternal Grandmother    Heart disease Maternal Grandfather    Breast cancer Neg Hx    Allergies  Allergen Reactions   Ceclor [Cefaclor]    Codeine  Nausea Only and Other (See Comments)    Caused extreme agitation & nausea   Meperidine Other (See Comments)    Caused extreme agitation   Morphine And Codeine    Penicillins    Xylocaine [Lidocaine Hcl]    Latex Rash   Nickel Rash      Review of Systems  All other systems reviewed and are negative.     Objective:     BP 120/72 (Cuff Size: Normal)   Pulse 90   Temp 97.7 F (36.5 C) (Oral)   Resp 16   Ht 5' 6 (1.676 m)   Wt 132 lb 6.4 oz (60.1 kg)   SpO2 99%   BMI 21.37 kg/m  BP Readings from Last 3 Encounters:  11/22/23 120/72  05/24/23 118/72  02/20/23 138/80   Wt Readings from Last 3 Encounters:  11/22/23 132 lb 6.4 oz (60.1 kg)  05/24/23 145 lb 4.8 oz (65.9 kg)  02/20/23 160 lb (72.6 kg)      Physical Exam Constitutional:      Appearance: Normal appearance.  HENT:     Head: Normocephalic and atraumatic.  Eyes:     Conjunctiva/sclera: Conjunctivae normal.  Cardiovascular:     Rate and Rhythm: Normal rate and regular rhythm.     Pulses:          Dorsalis pedis pulses are 2+ on the right side and 2+ on the left side.  Pulmonary:     Effort: Pulmonary effort is normal.     Breath sounds: Normal breath sounds.  Musculoskeletal:     Right foot: Normal range of motion. No deformity, bunion, Charcot foot, foot drop or prominent metatarsal heads.     Left foot: Normal range of motion. No deformity, bunion, Charcot foot, foot drop or prominent metatarsal heads.  Feet:     Right foot:     Protective Sensation: 6 sites tested.  6 sites sensed.     Skin integrity: Skin integrity normal.     Toenail Condition: Right toenails  are normal.     Left foot:     Protective Sensation: 6 sites tested.  6 sites sensed.     Skin integrity: Skin integrity normal.     Toenail Condition: Left toenails are normal.  Skin:    General: Skin is warm and dry.  Neurological:     General: No focal deficit present.     Mental Status: She is alert. Mental status is at baseline.   Psychiatric:        Mood and Affect: Mood normal.        Behavior: Behavior normal.      No results found for any visits on 11/22/23.   Last CBC Lab Results  Component Value Date   WBC 5.7 11/16/2022   HGB 13.1 11/16/2022   HCT 40.3 11/16/2022   MCV 83.3 11/16/2022   MCH 27.1 11/16/2022   RDW 13.6 11/16/2022   PLT 238 11/16/2022   Last metabolic panel Lab Results  Component Value Date   GLUCOSE 223 (H) 11/16/2022   NA 139 11/16/2022   K 5.0 11/16/2022   CL 102 11/16/2022   CO2 28 11/16/2022   BUN 12 11/16/2022   CREATININE 0.66 11/16/2022   EGFR 94 11/16/2022   CALCIUM 10.2 11/16/2022   PROT 6.9 11/16/2022   ALBUMIN 4.5 12/27/2015   BILITOT 0.5 11/16/2022   ALKPHOS 66 12/27/2015   AST 17 11/16/2022   ALT 18 11/16/2022   ANIONGAP 8 12/27/2015   Last lipids Lab Results  Component Value Date   CHOL 205 (H) 11/16/2022   HDL 56 11/16/2022   LDLCALC 122 (H) 11/16/2022   TRIG 157 (H) 11/16/2022   CHOLHDL 3.7 11/16/2022   Last hemoglobin A1c Lab Results  Component Value Date   HGBA1C 6.7 (A) 05/24/2023   Last thyroid  functions Lab Results  Component Value Date   TSH 0.70 11/16/2022   Last vitamin D  Lab Results  Component Value Date   VD25OH 64 11/16/2022   Last vitamin B12 and Folate Lab Results  Component Value Date   VITAMINB12 826 11/16/2022   FOLATE 20.9 11/16/2022      The 10-year ASCVD risk score (Arnett DK, et al., 2019) is: 19.2%    Assessment & Plan:   Assessment & Plan Trigger Finger Reports locking of right index finger, prefers injection over surgery. - Refer to orthopedics for evaluation and management.  Type 2 Diabetes Mellitus Managed with Ozempic  and metformin . Last A1c 6.7%. Discussed dietary concerns and emphasized natural sugars over processed. - Perform A1c test. - Conduct urine test for diabetic screening. - Perform foot exam for diabetic neuropathy. - Continue Ozempic  0.5 mg and metformin . - Advise on dietary  management and monitoring of blood glucose levels.  Hypothyroidism Well-managed on current medication, recheck labs today.   Hyperlipidemia Recheck fasting labs.   General Health Maintenance Up to date with screenings. Plans biennial mammograms. Taking vitamin D  and B12. Joined gym for physical activity. - Plan for next mammogram in spring 2026. - Continue vitamin D  and B12 supplements three times a week. - Encourage regular physical activity and follow gym's personalized workout plan.  - CBC w/Diff/Platelet - Comprehensive Metabolic Panel (CMET) - HgB A1c - Urine Microalbumin w/creat. ratio - HM Diabetes Foot Exam - TSH - Fe+TIBC+Fer - Lipid Profile - Ambulatory referral to Orthopedic Surgery   Return in about 6 months (around 05/24/2024).    Sharyle Fischer, DO

## 2023-11-22 NOTE — Telephone Encounter (Signed)
 Pt wanted to inform you that her eye dr is [facility]) Dr Christobal on [address]

## 2023-11-23 LAB — CBC WITH DIFFERENTIAL/PLATELET
Absolute Lymphocytes: 1585 {cells}/uL (ref 850–3900)
Absolute Monocytes: 325 {cells}/uL (ref 200–950)
Basophils Absolute: 30 {cells}/uL (ref 0–200)
Basophils Relative: 0.6 %
Eosinophils Absolute: 60 {cells}/uL (ref 15–500)
Eosinophils Relative: 1.2 %
HCT: 39.4 % (ref 35.0–45.0)
Hemoglobin: 12.7 g/dL (ref 11.7–15.5)
MCH: 27.7 pg (ref 27.0–33.0)
MCHC: 32.2 g/dL (ref 32.0–36.0)
MCV: 85.8 fL (ref 80.0–100.0)
MPV: 11.1 fL (ref 7.5–12.5)
Monocytes Relative: 6.5 %
Neutro Abs: 3000 {cells}/uL (ref 1500–7800)
Neutrophils Relative %: 60 %
Platelets: 239 Thousand/uL (ref 140–400)
RBC: 4.59 Million/uL (ref 3.80–5.10)
RDW: 13.6 % (ref 11.0–15.0)
Total Lymphocyte: 31.7 %
WBC: 5 Thousand/uL (ref 3.8–10.8)

## 2023-11-23 LAB — IRON,TIBC AND FERRITIN PANEL
%SAT: 23 % (ref 16–45)
Ferritin: 16 ng/mL (ref 16–288)
Iron: 77 ug/dL (ref 45–160)
TIBC: 334 ug/dL (ref 250–450)

## 2023-11-23 LAB — LIPID PANEL
Cholesterol: 200 mg/dL — ABNORMAL HIGH (ref <200)
HDL: 53 mg/dL (ref >=50)
LDL Cholesterol (Calc): 121 mg/dL — ABNORMAL HIGH
Non-HDL Cholesterol (Calc): 147 mg/dL — ABNORMAL HIGH (ref <130)
Total CHOL/HDL Ratio: 3.8 (calc) (ref <5.0)
Triglycerides: 150 mg/dL — ABNORMAL HIGH (ref <150)

## 2023-11-23 LAB — COMPREHENSIVE METABOLIC PANEL WITH GFR
AG Ratio: 2 (calc) (ref 1.0–2.5)
ALT: 8 U/L (ref 6–29)
AST: 12 U/L (ref 10–35)
Albumin: 4.2 g/dL (ref 3.6–5.1)
Alkaline phosphatase (APISO): 77 U/L (ref 37–153)
BUN/Creatinine Ratio: 15 (calc) (ref 6–22)
BUN: 8 mg/dL (ref 7–25)
CO2: 29 mmol/L (ref 20–32)
Calcium: 9.4 mg/dL (ref 8.6–10.4)
Chloride: 104 mmol/L (ref 98–110)
Creat: 0.54 mg/dL — ABNORMAL LOW (ref 0.60–1.00)
Globulin: 2.1 g/dL (ref 1.9–3.7)
Glucose, Bld: 110 mg/dL — ABNORMAL HIGH (ref 65–99)
Potassium: 4.7 mmol/L (ref 3.5–5.3)
Sodium: 139 mmol/L (ref 135–146)
Total Bilirubin: 0.4 mg/dL (ref 0.2–1.2)
Total Protein: 6.3 g/dL (ref 6.1–8.1)
eGFR: 98 mL/min/1.73m2 (ref >=60)

## 2023-11-23 LAB — MICROALBUMIN / CREATININE URINE RATIO
Creatinine, Urine: 74 mg/dL (ref 20–275)
Microalb Creat Ratio: 3 mg/g{creat} (ref <30)
Microalb, Ur: 0.2 mg/dL

## 2023-11-23 LAB — HEMOGLOBIN A1C
Hgb A1c MFr Bld: 6.4 % — ABNORMAL HIGH (ref <5.7)
Mean Plasma Glucose: 137 mg/dL
eAG (mmol/L): 7.6 mmol/L

## 2023-11-23 LAB — TSH: TSH: 0.02 m[IU]/L — ABNORMAL LOW (ref 0.40–4.50)

## 2023-11-26 ENCOUNTER — Ambulatory Visit: Payer: Self-pay | Admitting: Internal Medicine

## 2023-11-26 DIAGNOSIS — E039 Hypothyroidism, unspecified: Secondary | ICD-10-CM

## 2023-11-26 MED ORDER — LEVOTHYROXINE SODIUM 125 MCG PO TABS: 125.0000 ug | ORAL_TABLET | Freq: Every day | ORAL | 0 refills | Status: DC

## 2023-11-30 ENCOUNTER — Telehealth: Payer: Self-pay

## 2023-11-30 NOTE — Telephone Encounter (Signed)
 Pt is coming up due for re-enrollment on 12/28/23 have submitted PAP online and fax provider portion today will follow up.

## 2023-11-30 NOTE — Telephone Encounter (Signed)
 Gave [employer] a call to follow up on [facility] request pt not receiving refill on Ozempic ,spoke with [employer] representative explain pt prescription will be deliver today with a tracking [phone].

## 2023-12-03 NOTE — Telephone Encounter (Signed)
 Received provider portion of [employer] ozempic  fax it to [employer] today will follow in a few days

## 2023-12-05 NOTE — Telephone Encounter (Signed)
 Per [employer] pt has been APPROVED thru 12/24/2024 on OZEMPIC .left a HIPAA VM at pt number.

## 2023-12-26 ENCOUNTER — Other Ambulatory Visit: Payer: Self-pay | Admitting: Pharmacist

## 2023-12-26 DIAGNOSIS — E1165 Type 2 diabetes mellitus with hyperglycemia: Secondary | ICD-10-CM

## 2023-12-26 NOTE — Progress Notes (Signed)
 12/26/2023 Name: Catherine Dodson MRN: [id number] DOB: [birth year]  Chief Complaint  Patient presents with   Medication Management   Medication Assistance    English Catherine Dodson is a 73 y.o. year old female who presented for a telephone visit.   They were referred to the pharmacist by their PCP for assistance in managing diabetes and medication access.      Subjective:   Care Team: Primary Care Provider: Bernardo Fend, DO; Next Scheduled Visit: 05/26/2024  Medication Access/Adherence  Current Pharmacy:  [facility] [id number] - [place], [place] - [address] [place] [address] Phone: [phone] Fax: [phone]   Patient reports affordability concerns with their medications: No Patient reports access/transportation concerns to their pharmacy: No  Patient reports adherence concerns with their medications:  No       Diabetes:   Current medications:  - metformin  ER 750 mg twice daily - Ozempic  0.5 mg weekly on Tuesdays             Reports tolerating well   Reports recent morning fasting readings ranging ~110   Previous therapies tried: glipizide    Patient denies hypoglycemic s/sx including dizziness, shakiness, sweating.      Current physical activity: walking/exercises at gym or going for hike x most days/week   Statin therapy: none   Current medication access support: patient enrolled in patient assistance for Ozempic  from [facility] through 12/24/2024   Objective:  Lab Results  Component Value Date   HGBA1C 6.4 (H) 11/22/2023    Lab Results  Component Value Date   CREATININE 0.54 (L) 11/22/2023   BUN 8 11/22/2023   NA 139 11/22/2023   K 4.7 11/22/2023   CL 104 11/22/2023   CO2 29 11/22/2023    Lab Results  Component Value Date   CHOL 200 (H) 11/22/2023   HDL 53 11/22/2023   LDLCALC 121 (H) 11/22/2023   TRIG 150 (H) 11/22/2023   CHOLHDL 3.8 11/22/2023    Current Outpatient Medications on File Prior to  Visit  Medication Sig Dispense Refill   levothyroxine  (SYNTHROID ) 125 MCG tablet Take 1 tablet (125 mcg total) by mouth daily. 90 tablet 0   metFORMIN  (GLUCOPHAGE -XR) 750 MG 24 hr tablet TAKE 1 TABLET BY MOUTH 2 TIMES A DAY 180 tablet 0   Semaglutide ,0.25 or 0.5MG /DOS, (OZEMPIC , 0.25 OR 0.5 MG/DOSE,) 2 MG/3ML SOPN Inject 0.5 mg into the skin once a week. 3 mL 0   Accu-Chek Softclix Lancets lancets Use as instructed (Patient not taking: Reported on 05/24/2023) 100 each 12   glucose blood test strip Use as instructed to check blood sugar up to three times daily. E11.9 (Patient not taking: Reported on 05/24/2023) 100 each 12   No current facility-administered medications on file prior to visit.     Assessment/Plan:   Diabetes: - Controlled - Have reviewed long term cardiovascular and renal outcomes of uncontrolled blood sugar - Reviewed goal A1c, goal fasting, and goal 2 hour post prandial glucose - Reviewed dietary modifications including importance of having regular well-balanced meals and snacks while controlling carbohydrate portion sizes -Patient to check glucose, keep log of results and have this record to review at upcoming medical appointments. Patient to contact provider office sooner if needed for readings outside of established parameters or symptoms  - Patient to follow up with CPhT Ana Ollison-Moran as needed to initiate refills of Ozempic     Discussed with patient the importance of LDL control/ASCVD risk reduction.  Patient adamant that she does not want to try a statin medication as her sister had a bad reaction to one Will collaborate with PCP regarding alternative therapy options    Follow Up Plan: Schedule follow up for patient to speak with Clinical Pharmacist Peyton Ferries by telephone on 08/05/2024 at 2:00 PM        Sharyle Sia, PharmD, [facility] [phone]

## 2024-01-02 NOTE — Patient Instructions (Signed)
 Goals Addressed             This Visit's Progress    Pharmacy Goals       Our goal A1c is less than 7%. This corresponds with fasting sugars less than 130 and 2 hour after meal sugars less than 180. Please keep a log of your results when checking your blood sugar    Please monitor your supply of Ozempic  at home and when you have only a 1 month supply remaining, contact Shasta Spear at [phone] to request she start refill process for you.  Thank you!  Sharyle Sia, PharmD, [facility] Clinical Pharmacist [facility] [phone]

## 2024-01-11 ENCOUNTER — Encounter: Payer: Self-pay | Admitting: Internal Medicine

## 2024-01-11 NOTE — Telephone Encounter (Signed)
 Patient notified

## 2024-02-03 ENCOUNTER — Other Ambulatory Visit: Payer: Self-pay | Admitting: Internal Medicine

## 2024-02-03 DIAGNOSIS — E1165 Type 2 diabetes mellitus with hyperglycemia: Secondary | ICD-10-CM

## 2024-02-05 ENCOUNTER — Encounter: Payer: Self-pay | Admitting: Internal Medicine

## 2024-02-05 DIAGNOSIS — E1165 Type 2 diabetes mellitus with hyperglycemia: Secondary | ICD-10-CM

## 2024-02-05 MED ORDER — METFORMIN HCL ER 750 MG PO TB24: 750.0000 mg | ORAL_TABLET | Freq: Two times a day (BID) | ORAL | 0 refills | Status: DC

## 2024-02-05 NOTE — Telephone Encounter (Signed)
 Duplicate request, refilled 02/05/24.  Requested Prescriptions  Pending Prescriptions Disp Refills   metFORMIN  (GLUCOPHAGE -XR) 750 MG 24 hr tablet [Pharmacy Med Name: metFORMIN  HCL XR 750 MG TABLET] 180 tablet 0    Sig: TAKE 1 TABLET BY MOUTH 2 TIMES A DAY     Endocrinology:  Diabetes - Biguanides Failed - 02/05/2024 11:58 AM      Failed - Cr in normal range and within 360 days    Creat  Date Value Ref Range Status  11/22/2023 0.54 (L) 0.60 - 1.00 mg/dL Final   Creatinine, Urine  Date Value Ref Range Status  11/22/2023 74 20 - 275 mg/dL Final         Passed - HBA1C is between 0 and 7.9 and within 180 days    Hgb A1c MFr Bld  Date Value Ref Range Status  11/22/2023 6.4 (H) <5.7 % Final    Comment:    For someone without known diabetes, a hemoglobin  A1c value between 5.7% and 6.4% is consistent with prediabetes and should be confirmed with a  follow-up test. . For someone with known diabetes, a value <7% indicates that their diabetes is well controlled. A1c targets should be individualized based on duration of diabetes, age, comorbid conditions, and other considerations. . This assay result is consistent with an increased risk of diabetes. . Currently, no consensus exists regarding use of hemoglobin A1c for diagnosis of diabetes for children. .          Passed - eGFR in normal range and within 360 days    GFR calc Af Amer  Date Value Ref Range Status  12/27/2015 >60 >60 mL/min Final    Comment:    (NOTE) The eGFR has been calculated using the CKD EPI equation. This calculation has not been validated in all clinical situations. eGFR's persistently <60 mL/min signify possible Chronic Kidney Disease.    GFR calc non Af Amer  Date Value Ref Range Status  12/27/2015 >60 >60 mL/min Final   eGFR  Date Value Ref Range Status  11/22/2023 98 > OR = 60 mL/min/1.45m2 Final         Passed - B12 Level in normal range and within 720 days    Vitamin B-12  Date Value  Ref Range Status  11/16/2022 826 200 - 1,100 pg/mL Final         Passed - Valid encounter within last 6 months    Recent Outpatient Visits           2 months ago Type 2 diabetes mellitus with hyperglycemia, unspecified whether long term insulin  use [facility])   [facility] Bernardo Fend, DO       Future Appointments             In 3 months Bernardo Fend, DO [place] [facility], [place]            Passed - CBC within normal limits and completed in the last 12 months    WBC  Date Value Ref Range Status  11/22/2023 5.0 3.8 - 10.8 Thousand/uL Final   RBC  Date Value Ref Range Status  11/22/2023 4.59 3.80 - 5.10 Million/uL Final   Hemoglobin  Date Value Ref Range Status  11/22/2023 12.7 11.7 - 15.5 g/dL Final   HCT  Date Value Ref Range Status  11/22/2023 39.4 35.0 - 45.0 % Final   MCHC  Date Value Ref Range Status  11/22/2023 32.2 32.0 - 36.0 g/dL Final  Comment:    For adults, a slight decrease in the calculated MCHC value (in the range of 30 to 32 g/dL) is most likely not clinically significant; however, it should be interpreted with caution in correlation with other red cell parameters and the patient's clinical condition.    [facility]  Date Value Ref Range Status  11/22/2023 27.7 27.0 - 33.0 pg Final   MCV  Date Value Ref Range Status  11/22/2023 85.8 80.0 - 100.0 fL Final   No results found for: PLTCOUNTKUC, LABPLAT, POCPLA RDW  Date Value Ref Range Status  11/22/2023 13.6 11.0 - 15.0 % Final

## 2024-02-09 ENCOUNTER — Other Ambulatory Visit: Payer: Self-pay | Admitting: Internal Medicine

## 2024-02-09 DIAGNOSIS — E1165 Type 2 diabetes mellitus with hyperglycemia: Secondary | ICD-10-CM

## 2024-02-11 NOTE — Telephone Encounter (Signed)
 Second request

## 2024-02-12 NOTE — Telephone Encounter (Signed)
 Duplicate request, [facility] 02/05/24.  Requested Prescriptions  Pending Prescriptions Disp Refills   metFORMIN  (GLUCOPHAGE -XR) 750 MG 24 hr tablet [Pharmacy Med Name: metFORMIN  HCL XR 750 MG TABLET] 180 tablet 0    Sig: TAKE 1 TABLET BY MOUTH 2 TIMES A DAY     Endocrinology:  Diabetes - Biguanides Failed - 02/12/2024 11:25 AM      Failed - Cr in normal range and within 360 days    Creat  Date Value Ref Range Status  11/22/2023 0.54 (L) 0.60 - 1.00 mg/dL Final   Creatinine, Urine  Date Value Ref Range Status  11/22/2023 74 20 - 275 mg/dL Final         Passed - HBA1C is between 0 and 7.9 and within 180 days    Hgb A1c MFr Bld  Date Value Ref Range Status  11/22/2023 6.4 (H) <5.7 % Final    Comment:    For someone without known diabetes, a hemoglobin  A1c value between 5.7% and 6.4% is consistent with prediabetes and should be confirmed with a  follow-up test. . For someone with known diabetes, a value <7% indicates that their diabetes is well controlled. A1c targets should be individualized based on duration of diabetes, age, comorbid conditions, and other considerations. . This assay result is consistent with an increased risk of diabetes. . Currently, no consensus exists regarding use of hemoglobin A1c for diagnosis of diabetes for children. .          Passed - eGFR in normal range and within 360 days    GFR calc Af Amer  Date Value Ref Range Status  12/27/2015 >60 >60 mL/min Final    Comment:    (NOTE) The eGFR has been calculated using the CKD EPI equation. This calculation has not been validated in all clinical situations. eGFR's persistently <60 mL/min signify possible Chronic Kidney Disease.    GFR calc non Af Amer  Date Value Ref Range Status  12/27/2015 >60 >60 mL/min Final   eGFR  Date Value Ref Range Status  11/22/2023 98 > OR = 60 mL/min/1.71m2 Final         Passed - B12 Level in normal range and within 720 days    Vitamin B-12  Date Value Ref  Range Status  11/16/2022 826 200 - 1,100 pg/mL Final         Passed - Valid encounter within last 6 months    Recent Outpatient Visits           2 months ago Type 2 diabetes mellitus with hyperglycemia, unspecified whether long term insulin  use [facility])   [place] [facility] Bernardo Fend, DO       Future Appointments             In 3 months Bernardo Fend, DO [facility], [place]            Passed - CBC within normal limits and completed in the last 12 months    WBC  Date Value Ref Range Status  11/22/2023 5.0 3.8 - 10.8 Thousand/uL Final   RBC  Date Value Ref Range Status  11/22/2023 4.59 3.80 - 5.10 Million/uL Final   Hemoglobin  Date Value Ref Range Status  11/22/2023 12.7 11.7 - 15.5 g/dL Final   HCT  Date Value Ref Range Status  11/22/2023 39.4 35.0 - 45.0 % Final   MCHC  Date Value Ref Range Status  11/22/2023 32.2 32.0 - 36.0 g/dL Final  Comment:    For adults, a slight decrease in the calculated MCHC value (in the range of 30 to 32 g/dL) is most likely not clinically significant; however, it should be interpreted with caution in correlation with other red cell parameters and the patient's clinical condition.    [facility]  Date Value Ref Range Status  11/22/2023 27.7 27.0 - 33.0 pg Final   MCV  Date Value Ref Range Status  11/22/2023 85.8 80.0 - 100.0 fL Final   No results found for: PLTCOUNTKUC, LABPLAT, POCPLA RDW  Date Value Ref Range Status  11/22/2023 13.6 11.0 - 15.0 % Final

## 2024-02-14 ENCOUNTER — Other Ambulatory Visit: Payer: Self-pay | Admitting: Internal Medicine

## 2024-02-14 DIAGNOSIS — E039 Hypothyroidism, unspecified: Secondary | ICD-10-CM

## 2024-02-15 NOTE — Telephone Encounter (Signed)
 Requested Prescriptions  Pending Prescriptions Disp Refills   levothyroxine  (SYNTHROID ) 125 MCG tablet [Pharmacy Med Name: LEVOTHYROXINE  125 MCG TABLET] 90 tablet 0    Sig: TAKE 1 TABLET BY MOUTH DAILY     Endocrinology:  Hypothyroid Agents Failed - 02/15/2024  4:08 PM      Failed - TSH in normal range and within 360 days    TSH  Date Value Ref Range Status  11/22/2023 0.02 (L) 0.40 - 4.50 mIU/L Final         Passed - Valid encounter within last 12 months    Recent Outpatient Visits           2 months ago Type 2 diabetes mellitus with hyperglycemia, unspecified whether long term insulin  use [facility])   [place] [facility] Bernardo Fend, DO       Future Appointments             In 3 months Bernardo Fend, DO [facility], [place]

## 2024-03-03 ENCOUNTER — Encounter: Payer: Self-pay | Admitting: Radiology

## 2024-05-03 ENCOUNTER — Other Ambulatory Visit: Payer: Self-pay | Admitting: Internal Medicine

## 2024-05-03 DIAGNOSIS — E1165 Type 2 diabetes mellitus with hyperglycemia: Secondary | ICD-10-CM

## 2024-05-05 NOTE — Telephone Encounter (Signed)
 Requested Prescriptions  Pending Prescriptions Disp Refills   metFORMIN  (GLUCOPHAGE -XR) 750 MG 24 hr tablet [Pharmacy Med Name: metFORMIN  HCL XR 750 MG TABLET] 180 tablet 0    Sig: TAKE 1 TABLET BY MOUTH 2 TIMES A DAY     Endocrinology:  Diabetes - Biguanides Failed - 05/05/2024  1:59 PM      Failed - Cr in normal range and within 360 days    Creat  Date Value Ref Range Status  11/22/2023 0.54 (L) 0.60 - 1.00 mg/dL Final   Creatinine, Urine  Date Value Ref Range Status  11/22/2023 74 20 - 275 mg/dL Final         Passed - HBA1C is between 0 and 7.9 and within 180 days    Hgb A1c MFr Bld  Date Value Ref Range Status  11/22/2023 6.4 (H) <5.7 % Final    Comment:    For someone without known diabetes, a hemoglobin  A1c value between 5.7% and 6.4% is consistent with prediabetes and should be confirmed with a  follow-up test. . For someone with known diabetes, a value <7% indicates that their diabetes is well controlled. A1c targets should be individualized based on duration of diabetes, age, comorbid conditions, and other considerations. . This assay result is consistent with an increased risk of diabetes. . Currently, no consensus exists regarding use of hemoglobin A1c for diagnosis of diabetes for children. .          Passed - eGFR in normal range and within 360 days    GFR calc Af Amer  Date Value Ref Range Status  12/27/2015 >60 >60 mL/min Final    Comment:    (NOTE) The eGFR has been calculated using the CKD EPI equation. This calculation has not been validated in all clinical situations. eGFR's persistently <60 mL/min signify possible Chronic Kidney Disease.    GFR calc non Af Amer  Date Value Ref Range Status  12/27/2015 >60 >60 mL/min Final   eGFR  Date Value Ref Range Status  11/22/2023 98 > OR = 60 mL/min/1.74m2 Final         Passed - B12 Level in normal range and within 720 days    Vitamin B-12  Date Value Ref Range Status  11/16/2022 826 200 -  1,100 pg/mL Final         Passed - Valid encounter within last 6 months    Recent Outpatient Visits           5 months ago Type 2 diabetes mellitus with hyperglycemia, unspecified whether long term insulin  use [facility])   [place] [facility] Bernardo Fend, DO       Future Appointments             In 3 weeks Bernardo Fend, DO [place] [facility], [place]            Passed - CBC within normal limits and completed in the last 12 months    WBC  Date Value Ref Range Status  11/22/2023 5.0 3.8 - 10.8 Thousand/uL Final   RBC  Date Value Ref Range Status  11/22/2023 4.59 3.80 - 5.10 Million/uL Final   Hemoglobin  Date Value Ref Range Status  11/22/2023 12.7 11.7 - 15.5 g/dL Final   HCT  Date Value Ref Range Status  11/22/2023 39.4 35.0 - 45.0 % Final   MCHC  Date Value Ref Range Status  11/22/2023 32.2 32.0 - 36.0 g/dL Final    Comment:  For adults, a slight decrease in the calculated MCHC value (in the range of 30 to 32 g/dL) is most likely not clinically significant; however, it should be interpreted with caution in correlation with other red cell parameters and the patient's clinical condition.    [facility]  Date Value Ref Range Status  11/22/2023 27.7 27.0 - 33.0 pg Final   MCV  Date Value Ref Range Status  11/22/2023 85.8 80.0 - 100.0 fL Final   No results found for: PLTCOUNTKUC, LABPLAT, POCPLA RDW  Date Value Ref Range Status  11/22/2023 13.6 11.0 - 15.0 % Final

## 2024-05-13 ENCOUNTER — Other Ambulatory Visit: Payer: Self-pay | Admitting: Internal Medicine

## 2024-05-13 DIAGNOSIS — E039 Hypothyroidism, unspecified: Secondary | ICD-10-CM

## 2024-05-14 ENCOUNTER — Telehealth: Payer: Self-pay

## 2024-05-14 NOTE — Telephone Encounter (Signed)
 Received refill request from [employer] (Ozempic ),filled and faxed to provider office to sing and date, and faxed back to [phone].

## 2024-05-14 NOTE — Telephone Encounter (Signed)
 Requested by interface surescripts. Future visit 05/26/24.  Requested Prescriptions  Pending Prescriptions Disp Refills   levothyroxine  (SYNTHROID ) 125 MCG tablet [Pharmacy Med Name: LEVOTHYROXINE  125 MCG TABLET] 90 tablet 0    Sig: TAKE 1 TABLET BY MOUTH DAILY     Endocrinology:  Hypothyroid Agents Failed - 05/14/2024  1:02 PM      Failed - TSH in normal range and within 360 days    TSH  Date Value Ref Range Status  11/22/2023 0.02 (L) 0.40 - 4.50 mIU/L Final         Passed - Valid encounter within last 12 months    Recent Outpatient Visits           5 months ago Type 2 diabetes mellitus with hyperglycemia, unspecified whether long term insulin  use [facility])   [facility] Bernardo Fend, DO       Future Appointments             In 1 week Bernardo Fend, DO [facility], [place]

## 2024-05-20 NOTE — Telephone Encounter (Signed)
 Received provider portion Refill request Novo Nordisk back from provider faxed to [employer].

## 2024-05-26 ENCOUNTER — Ambulatory Visit: Admitting: Internal Medicine

## 2024-06-18 ENCOUNTER — Ambulatory Visit: Admitting: Internal Medicine

## 2024-08-05 ENCOUNTER — Other Ambulatory Visit
